# Patient Record
Sex: Female | Born: 1975
Health system: Southern US, Community
[De-identification: ages and names within clinical notes are randomized; demographics above are authoritative.]

## PROBLEM LIST (undated history)

## (undated) DIAGNOSIS — Z62819 Personal history of unspecified abuse in childhood: Secondary | ICD-10-CM

## (undated) DIAGNOSIS — T7840XA Allergy, unspecified, initial encounter: Secondary | ICD-10-CM

## (undated) DIAGNOSIS — K759 Inflammatory liver disease, unspecified: Secondary | ICD-10-CM

## (undated) DIAGNOSIS — R109 Unspecified abdominal pain: Secondary | ICD-10-CM

## (undated) DIAGNOSIS — B977 Papillomavirus as the cause of diseases classified elsewhere: Secondary | ICD-10-CM

## (undated) DIAGNOSIS — K429 Umbilical hernia without obstruction or gangrene: Secondary | ICD-10-CM

## (undated) DIAGNOSIS — G4700 Insomnia, unspecified: Secondary | ICD-10-CM

## (undated) DIAGNOSIS — G8929 Other chronic pain: Secondary | ICD-10-CM

## (undated) DIAGNOSIS — R87612 Low grade squamous intraepithelial lesion on cytologic smear of cervix (LGSIL): Secondary | ICD-10-CM

## (undated) DIAGNOSIS — K219 Gastro-esophageal reflux disease without esophagitis: Secondary | ICD-10-CM

## (undated) DIAGNOSIS — F32A Depression, unspecified: Secondary | ICD-10-CM

## (undated) DIAGNOSIS — E785 Hyperlipidemia, unspecified: Secondary | ICD-10-CM

## (undated) DIAGNOSIS — F419 Anxiety disorder, unspecified: Secondary | ICD-10-CM

## (undated) DIAGNOSIS — E559 Vitamin D deficiency, unspecified: Secondary | ICD-10-CM

## (undated) DIAGNOSIS — F329 Major depressive disorder, single episode, unspecified: Secondary | ICD-10-CM

## (undated) DIAGNOSIS — J45909 Unspecified asthma, uncomplicated: Secondary | ICD-10-CM

## (undated) DIAGNOSIS — D126 Benign neoplasm of colon, unspecified: Secondary | ICD-10-CM

## (undated) DIAGNOSIS — O009 Unspecified ectopic pregnancy without intrauterine pregnancy: Secondary | ICD-10-CM

## (undated) DIAGNOSIS — K2970 Gastritis, unspecified, without bleeding: Secondary | ICD-10-CM

## (undated) DIAGNOSIS — D219 Benign neoplasm of connective and other soft tissue, unspecified: Secondary | ICD-10-CM

## (undated) HISTORY — DX: Personal history of unspecified abuse in childhood: Z62.819

## (undated) HISTORY — DX: Unspecified abdominal pain: R10.9

## (undated) HISTORY — DX: Other chronic pain: G89.29

## (undated) HISTORY — DX: Unspecified ectopic pregnancy without intrauterine pregnancy: O00.90

## (undated) HISTORY — DX: Papillomavirus as the cause of diseases classified elsewhere: B97.7

## (undated) HISTORY — DX: Benign neoplasm of colon, unspecified: D12.6

## (undated) HISTORY — PX: OTHER SURGICAL HISTORY: SHX169

## (undated) HISTORY — DX: Low grade squamous intraepithelial lesion on cytologic smear of cervix (LGSIL): R87.612

## (undated) HISTORY — DX: Gastro-esophageal reflux disease without esophagitis: K21.9

## (undated) HISTORY — DX: Allergy, unspecified, initial encounter: T78.40XA

## (undated) HISTORY — DX: Hyperlipidemia, unspecified: E78.5

## (undated) HISTORY — DX: Unspecified asthma, uncomplicated: J45.909

## (undated) HISTORY — PX: TONSILLECTOMY: SUR1361

## (undated) HISTORY — DX: Benign neoplasm of connective and other soft tissue, unspecified: D21.9

## (undated) HISTORY — DX: Vitamin D deficiency, unspecified: E55.9

## (undated) HISTORY — DX: Insomnia, unspecified: G47.00

## (undated) HISTORY — DX: Gastritis, unspecified, without bleeding: K29.70

## (undated) HISTORY — DX: Umbilical hernia without obstruction or gangrene: K42.9

---

## 2005-11-20 HISTORY — PX: OTHER SURGICAL HISTORY: SHX169

## 2013-11-03 ENCOUNTER — Encounter: Payer: Self-pay | Admitting: *Deleted

## 2013-11-04 ENCOUNTER — Encounter: Payer: Self-pay | Admitting: Emergency Medicine

## 2013-11-04 ENCOUNTER — Ambulatory Visit (INDEPENDENT_AMBULATORY_CARE_PROVIDER_SITE_OTHER): Payer: BC Managed Care – PPO | Admitting: Emergency Medicine

## 2013-11-04 ENCOUNTER — Encounter (INDEPENDENT_AMBULATORY_CARE_PROVIDER_SITE_OTHER): Payer: Self-pay

## 2013-11-04 VITALS — BP 110/70 | HR 82 | Ht 64.0 in | Wt 155.6 lb

## 2013-11-04 DIAGNOSIS — J45909 Unspecified asthma, uncomplicated: Secondary | ICD-10-CM

## 2013-11-04 NOTE — Patient Instructions (Signed)
Please stop your Flovent  Use your albuterol 2 puffs before exercise or if needed for shortness of breath Follow with Dr Delton Coombes in 12 months or sooner if you have any problems. Call if you need to use your albuterol more frequently

## 2013-11-04 NOTE — Progress Notes (Signed)
Subjective:    Patient ID: Sara Bishop, female    DOB: 17-Jun-1976, 37 y.o.   MRN: [id number]  HPI 37 yo woman, never smoker, hx asthma since childhood and on meds since that time. She has albuterol to use prn.  She has been on scheduled meds before but not currently. She rarely has flares, but was treated in late November > SOB, anxiety, CP, "my throat was closing". Was treated for an AE-asthma in the ED, only slightly better. Received pred taper - she had trouble taking it due to side effects. She has had spirometry that shows moderate AFL. She was started on flovent, took it until about 2 days ago. She uses albuterol rarely, although she did use during the flare at Thanksgiving.                                                        Review of Systems  Constitutional: Negative for fever and unexpected weight change.  HENT: Positive for sore throat. Negative for congestion, dental problem, ear pain, nosebleeds, postnasal drip, rhinorrhea, sinus pressure, sneezing and trouble swallowing.   Eyes: Negative for redness and itching.  Respiratory: Positive for chest tightness and shortness of breath. Negative for cough and wheezing.   Cardiovascular: Negative for palpitations and leg swelling.  Gastrointestinal: Positive for abdominal pain. Negative for nausea and vomiting.  Genitourinary: Negative for dysuria.  Musculoskeletal: Negative for joint swelling.  Skin: Negative for rash.  Neurological: Negative for headaches.  Hematological: Does not bruise/bleed easily.  Psychiatric/Behavioral: Positive for dysphoric mood. The patient is nervous/anxious.    Past Medical History  Diagnosis Date  . Asthma   . Umbilical hernia      Family History  Problem Relation Age of Onset  . Hypertension Father      History   Social History  . Marital Status: Unknown    Spouse Name: N/A    Number of Children: N/A  . Years of Education: N/A   Occupational History  . Not on file.   Social  History Main Topics  . Smoking status: Never Smoker   . Smokeless tobacco: Not on file  . Alcohol Use: Yes     Comment: rare  . Drug Use: No  . Sexual Activity: Not on file   Other Topics Concern  . Not on file   Social History Narrative  . No narrative on file  From   Not on File   No outpatient prescriptions prior to visit.   No facility-administered medications prior to visit.       Objective:   Physical Exam Filed Vitals:   11/04/13 1157  BP: 110/70  Pulse: 82  Height: 5\' 4"  (1.626 m)  Weight: 155 lb 9.6 oz (70.58 kg)  SpO2: 99%   Gen: Pleasant, well-nourished, in no distress,  normal affect  ENT: No lesions,  mouth clear,  oropharynx clear, no postnasal drip  Neck: No JVD, no TMG, no carotid bruits  Lungs: No use of accessory muscles, no dullness to percussion, clear without rales or rhonchi  Cardiovascular: RRR, heart sounds normal, no murmur or gallops, no peripheral edema  Musculoskeletal: No deformities, no cyanosis or clubbing  Neuro: alert, non focal  Skin: Warm, no lesions or rashes     Assessment & Plan:  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 Intermittent sx.  - d/c flovent - albuterol prn - will ramp up therapy if sx increase - rov annually

## 2013-11-04 NOTE — Assessment & Plan Note (Signed)
Asthma confirmed on spiro > moderate AFL. Intermittent sx.  - d/c flovent - albuterol prn - will ramp up therapy if sx increase - rov annually

## 2013-11-20 HISTORY — PX: HERNIA REPAIR: SHX51

## 2013-11-20 HISTORY — PX: BREAST ENHANCEMENT SURGERY: SHX7

## 2013-11-20 HISTORY — PX: BREAST SURGERY: SHX581

## 2013-11-25 ENCOUNTER — Other Ambulatory Visit: Payer: Self-pay | Admitting: Family Medicine

## 2013-11-25 DIAGNOSIS — N946 Dysmenorrhea, unspecified: Secondary | ICD-10-CM

## 2013-12-03 ENCOUNTER — Ambulatory Visit
Admission: RE | Admit: 2013-12-03 | Discharge: 2013-12-03 | Disposition: A | Discharge status: Home / Self Care | Payer: BC Managed Care – PPO | Source: Ambulatory Visit | Attending: Family Medicine | Admitting: Family Medicine

## 2013-12-03 DIAGNOSIS — N946 Dysmenorrhea, unspecified: Secondary | ICD-10-CM

## 2013-12-03 IMAGING — US US PELVIS COMPLETE
1 series · 13 of 25 positions shown · non-contrast
Comparison: None

CLINICAL DATA: Dysmenorrhea.  Menorrhagia.  LMP [DATE].

EXAM:
TRANSABDOMINAL AND TRANSVAGINAL ULTRASOUND OF PELVIS
TECHNIQUE: Both transabdominal and transvaginal ultrasound examinations of the
pelvis were performed. Transabdominal technique was performed for
global imaging of the pelvis including uterus, ovaries, adnexal
regions, and pelvic cul-de-sac. It was necessary to proceed with
endovaginal exam following the transabdominal exam to visualize the
endometrium and ovaries.

[Series 1: us pelvis complete · 0.32mm/px · 13 of 70 slices shown]
[im 1/70]
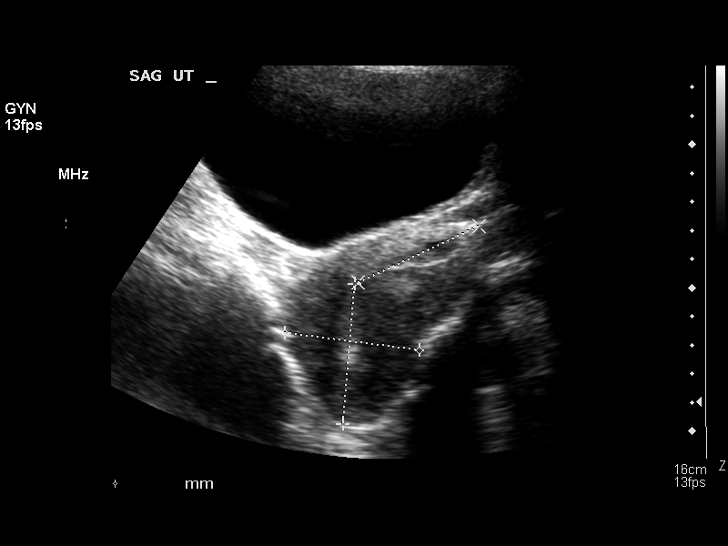
[im 6/70]
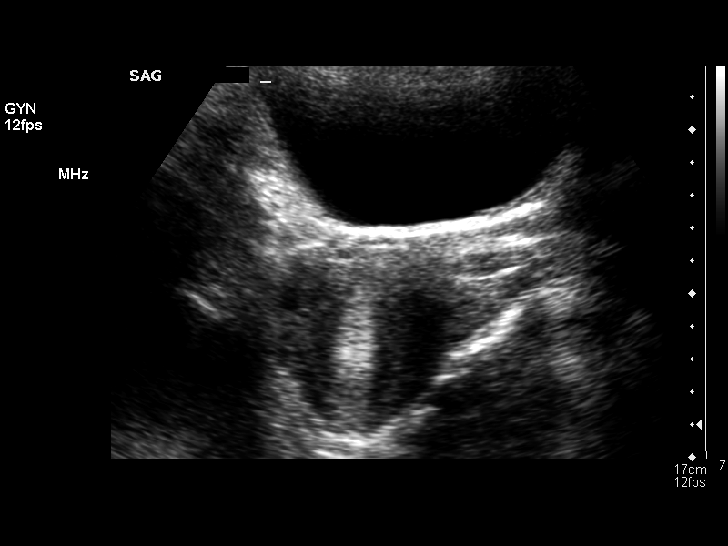
[im 12/70]
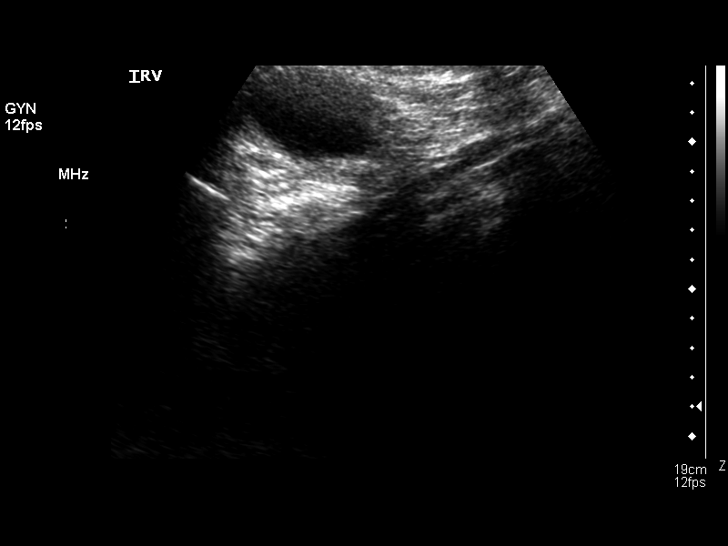
[im 18/70]
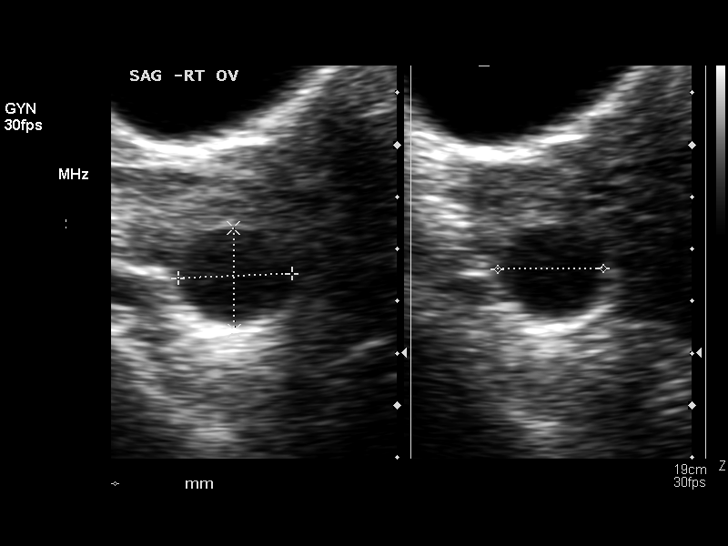
[im 24/70]
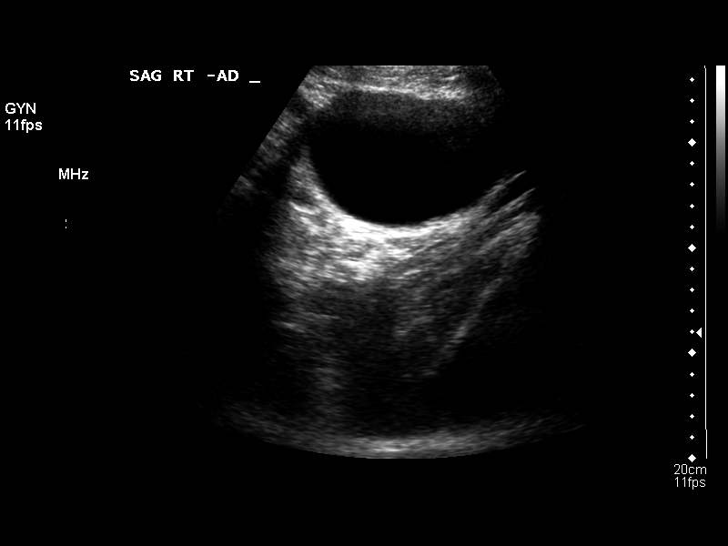
[im 29/70]
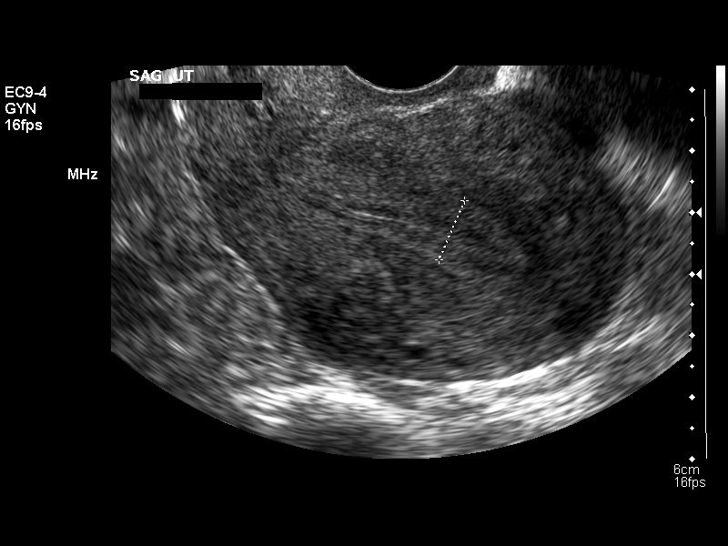
[im 35/70]
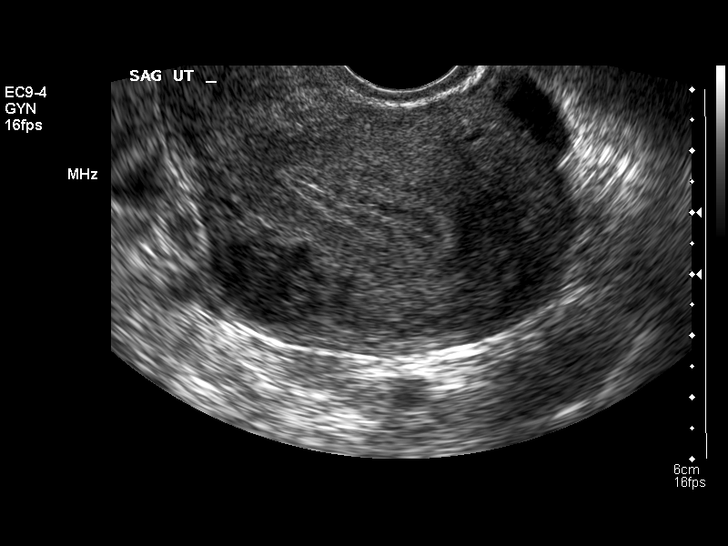
[im 41/70]
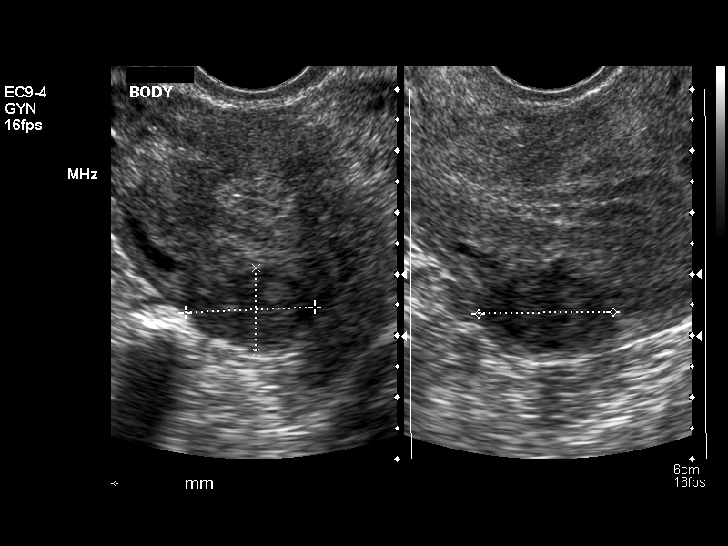
[im 47/70]
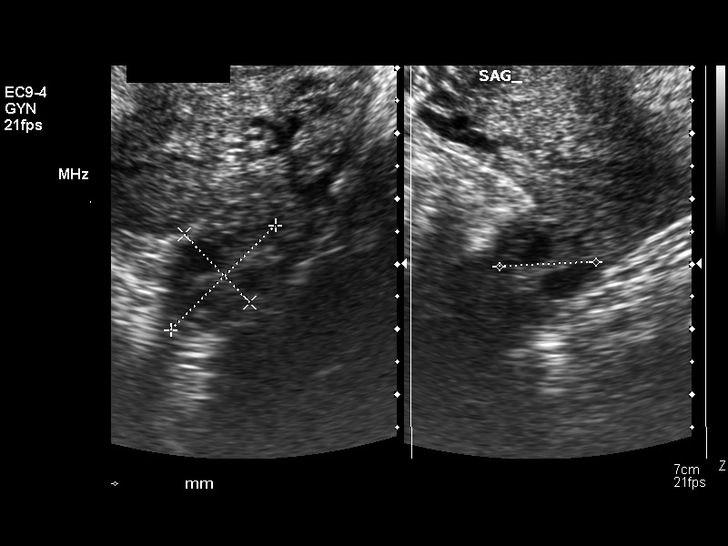
[im 52/70]
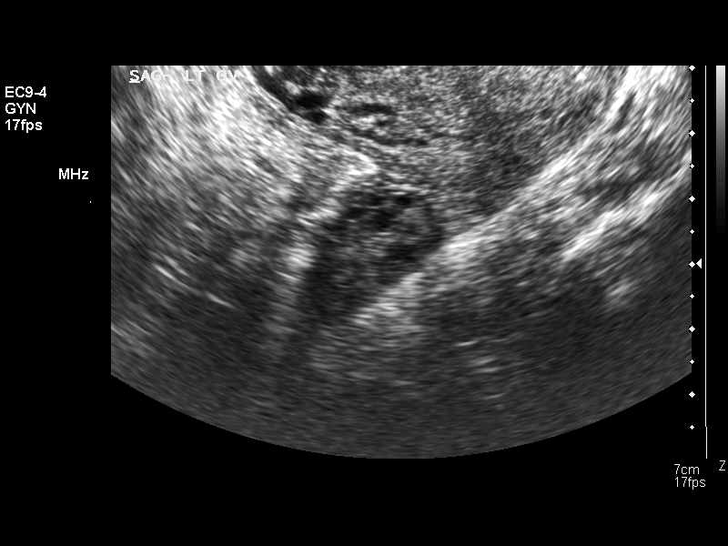
[im 58/70]
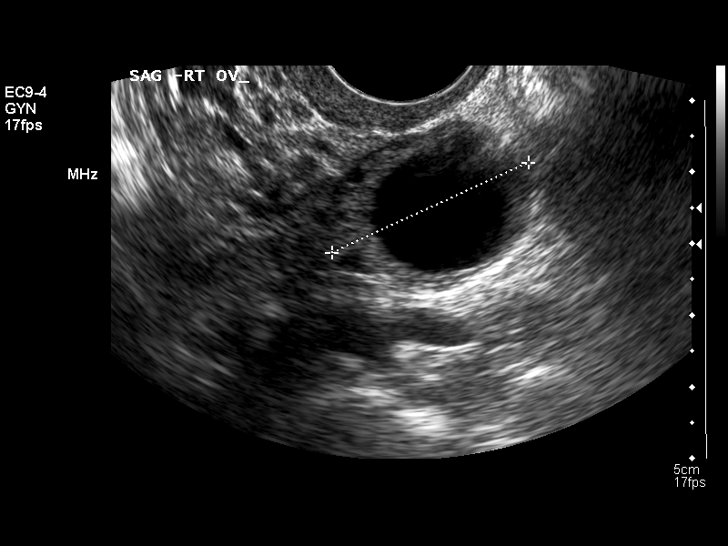
[im 64/70]
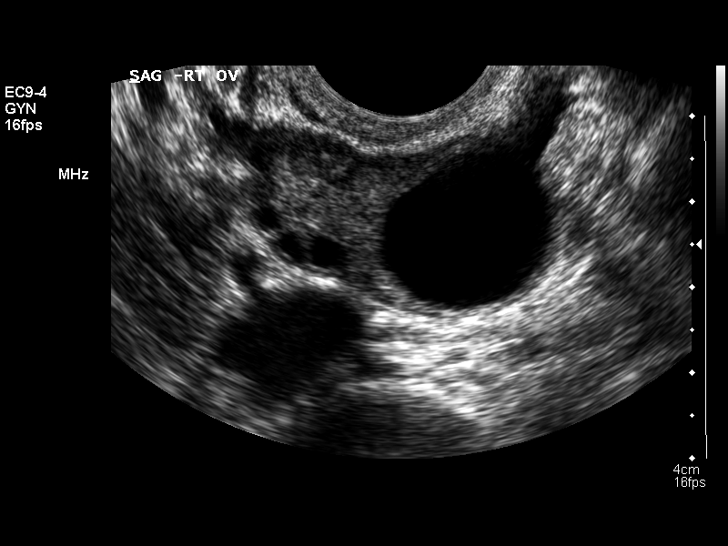
[im 70/70]
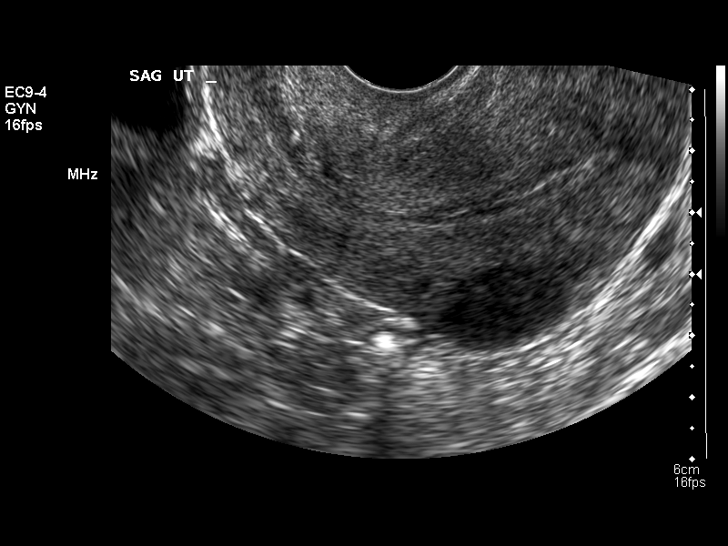

[13 of 25 positions shown; findings below may reference images not displayed]

FINDINGS: Uterus

Measurements: 9.1 x 4.9 x 5.8 cm. Retroverted. A small intramural
fibroid is seen in the anterior fundus measuring 1.2 cm in maximum
diameter. A second intramural fibroid is seen in the anterior corpus
measuring 2.2 cm in maximum diameter.

Endometrium

Thickness: 11 mm transvaginally.  No focal abnormality visualized.

Right ovary

Measurements: 4.0 x 2.2 x 3.0 cm. 2 cm follicle noted. Normal
appearance/no adnexal mass.

Left ovary

Measurements: 2.3 x 1.5 x 1.5 cm. Normal appearance/no adnexal mass.

Other findings

Trace amount of free fluid noted in pelvic cul-de-sac.
IMPRESSION: Two small uterine fibroids, largest measuring 2.2 cm.

Normal appearance of both ovaries.  No adnexal mass identified.

## 2013-12-25 ENCOUNTER — Other Ambulatory Visit: Payer: Self-pay | Admitting: Family Medicine

## 2013-12-25 ENCOUNTER — Other Ambulatory Visit (HOSPITAL_COMMUNITY)
Admission: RE | Admit: 2013-12-25 | Discharge: 2013-12-25 | Disposition: A | Discharge status: Home / Self Care | Payer: BC Managed Care – PPO | Source: Ambulatory Visit | Attending: Family Medicine | Admitting: Family Medicine

## 2013-12-25 DIAGNOSIS — Z113 Encounter for screening for infections with a predominantly sexual mode of transmission: Secondary | ICD-10-CM | POA: Insufficient documentation

## 2013-12-25 DIAGNOSIS — Z1151 Encounter for screening for human papillomavirus (HPV): Secondary | ICD-10-CM | POA: Insufficient documentation

## 2013-12-25 DIAGNOSIS — Z124 Encounter for screening for malignant neoplasm of cervix: Secondary | ICD-10-CM | POA: Insufficient documentation

## 2014-01-05 ENCOUNTER — Ambulatory Visit (INDEPENDENT_AMBULATORY_CARE_PROVIDER_SITE_OTHER): Payer: BC Managed Care – PPO | Admitting: General Surgery

## 2014-01-12 ENCOUNTER — Ambulatory Visit (INDEPENDENT_AMBULATORY_CARE_PROVIDER_SITE_OTHER): Payer: BC Managed Care – PPO | Admitting: General Surgery

## 2014-01-12 ENCOUNTER — Encounter (INDEPENDENT_AMBULATORY_CARE_PROVIDER_SITE_OTHER): Payer: Self-pay | Admitting: General Surgery

## 2014-01-12 VITALS — BP 107/70 | HR 68 | Temp 99.2°F | Resp 14 | Ht 64.0 in | Wt 154.6 lb

## 2014-01-12 DIAGNOSIS — R1033 Periumbilical pain: Secondary | ICD-10-CM

## 2014-01-12 NOTE — Progress Notes (Signed)
Patient ID: Sara Bishop, female   DOB: 25-Jun-1976, 38 y.o.   MRN: [id number]  Chief Complaint  Patient presents with  . Umbilical Hernia    new pt    HPI Sara Bishop is a 38 y.o. female.  She is referred by Dr. [name] for evaluation of possible umbilical hernia and umbilical pain.  The patient gives a two-year history of umbilical pain. She states she feels a bulge occasionally, usually during strenuous exercise or after a big meal. She's never had any hernia surgery.  She says she's been evaluated in [place], [place]. She says that she had an ultrasound and is actually planning to have umbilical hernia repair there at some point in the future. A further questions she said she just wanted another opinion.  Comorbidities included asthma and small uterine fibroids. Otherwise healthy HPI  Past Medical History  Diagnosis Date  . Asthma   . Umbilical hernia     Past Surgical History  Procedure Laterality Date  . Tonsillectomy    . Broken ankle repair      Family History  Problem Relation Age of Onset  . Hypertension Father   . Cancer Maternal Aunt     breast    Social History History  Substance Use Topics  . Smoking status: Never Smoker   . Smokeless tobacco: Not on file  . Alcohol Use: Yes     Comment: rare    No Known Allergies  Current Outpatient Prescriptions  Medication Sig Dispense Refill  . albuterol (PROVENTIL HFA;VENTOLIN HFA) 108 (90 BASE) MCG/ACT inhaler Inhale 2 puffs into the lungs every 6 (six) hours as needed for wheezing or shortness of breath.      . vitamin B-12 (CYANOCOBALAMIN) 1000 MCG tablet Take 1,000 mcg by mouth daily.       No current facility-administered medications for this visit.    Review of Systems Review of Systems  Constitutional: Negative for fever, chills and unexpected weight change.  HENT: Negative for congestion, hearing loss, sore throat, trouble swallowing and voice change.    Eyes: Negative for visual disturbance.  Respiratory: Negative for cough and wheezing.   Cardiovascular: Negative for chest pain, palpitations and leg swelling.  Gastrointestinal: Negative for nausea, vomiting, abdominal pain, diarrhea, constipation, blood in stool, abdominal distention and anal bleeding.  Genitourinary: Negative for hematuria, vaginal bleeding and difficulty urinating.  Musculoskeletal: Negative for arthralgias.  Skin: Negative for rash and wound.  Neurological: Negative for seizures, syncope and headaches.  Hematological: Negative for adenopathy. Does not bruise/bleed easily.  Psychiatric/Behavioral: Negative for confusion.    Blood pressure 107/70, pulse 68, temperature 99.2 F (37.3 C), temperature source Oral, resp. rate 14, height 5\' 4"  (1.626 m), weight 154 lb 9.6 oz (70.126 kg).  Physical Exam Physical Exam  Data Reviewed Dr. Irma Newness office note  Assessment    Umbilical pain. Hernia could not be demonstrated on exam today. There is no indication for surgical intervention at this point in time.  Asthma  Uterine fibroids     Plan    I had a long talk with the patient about my physical findings and the fact that I could not demonstrate a hernia. I gave her the option of doing nothing or scheduling a CT scan of the abdomen and pelvis to make sure were not missing something. It was her desire to go as far as we could be sure were not missing something. This potentially could be a low yield exam.  She'll be scheduled for CT scan of the abdomen and pelvis with contrast.  She will call me after the CT scan is done to discuss the findings and if there is any further steps to take.        Edsel Petrin. Dalbert Batman, M.D., [facility] General and Minimally invasive Surgery Breast and Colorectal Surgery Office:   [phone] Pager:   [phone]  01/12/2014, 5:20 PM

## 2014-01-12 NOTE — Patient Instructions (Signed)
You report a two-year history of intermittent pain at your umbilicus, and you reported that you occasionally feel a bulge either after eating a large meal or during exercise.  On examination today we cannot demonstrate a bulge or a hernia or any other disease process of the umbilicus. There is therefore no immediate indication for surgery.  We talked about different strategies for managing your pain. It was your preference to proceed with imaging to make sure that there is not something else going on behind the umbilicus.  Please call Dr. Darrel Hoover office a couple of days after the CT scan is done and we will discuss this with you.  Dr. Dalbert Batman will be happy to see you again in the future if necessary.

## 2014-01-20 ENCOUNTER — Other Ambulatory Visit: Payer: BC Managed Care – PPO

## 2014-01-20 ENCOUNTER — Ambulatory Visit
Admission: RE | Admit: 2014-01-20 | Discharge: 2014-01-20 | Disposition: A | Discharge status: Home / Self Care | Payer: BC Managed Care – PPO | Source: Ambulatory Visit | Attending: General Surgery | Admitting: General Surgery

## 2014-01-20 IMAGING — CT CT ABD-PELV W/ CM
2 of 4 series · 10 of 36 positions shown, 17 images · IV contrast (omnipaque)
Comparison: US TRANSVAGINAL NON-OB dated [DATE]

CLINICAL DATA: Umbilical hernia

EXAM:
CT ABDOMEN AND PELVIS WITH CONTRAST
TECHNIQUE: Multidetector CT imaging of the abdomen and pelvis was performed
using the standard protocol following bolus administration of
intravenous contrast.
CONTRAST:  100 cc Omnipaque 300

[Series 3: abd/pelvis with · axial · 0.70mm/px · z∈[-352,-17]mm · 9 of 85 slices shown, 15 images]
[im 9/85  soft-tissue]
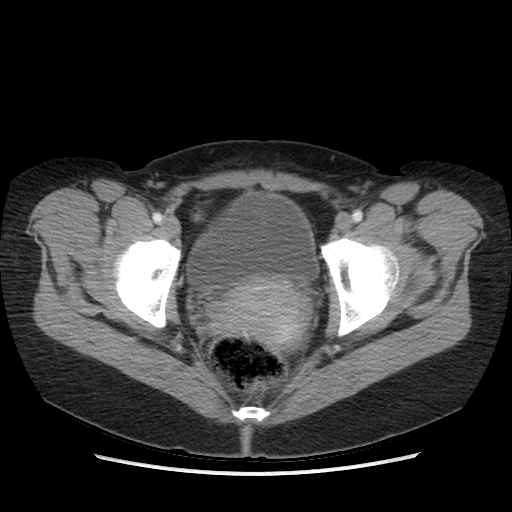
[im 9/85  bone]
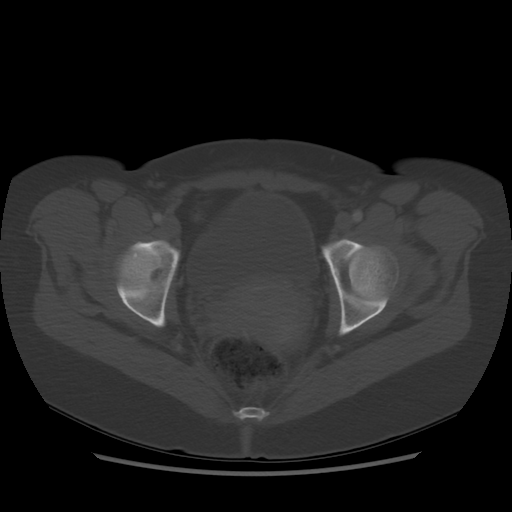
[im 17/85  soft-tissue]
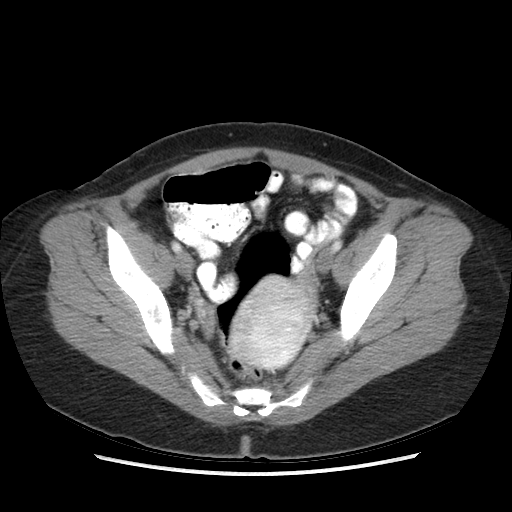
[im 26/85  soft-tissue]
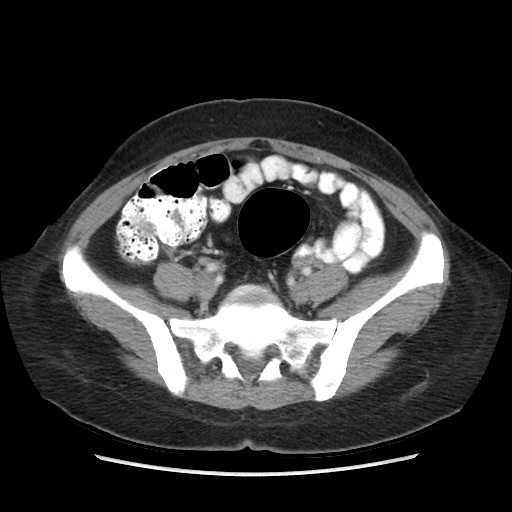
[im 34/85  soft-tissue]
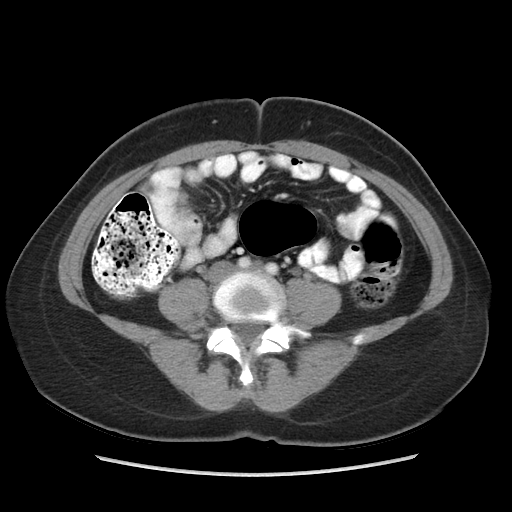
[im 43/85  soft-tissue]
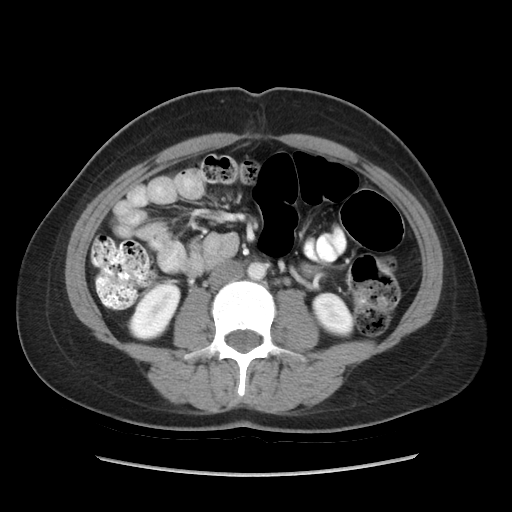
[im 51/85  soft-tissue]
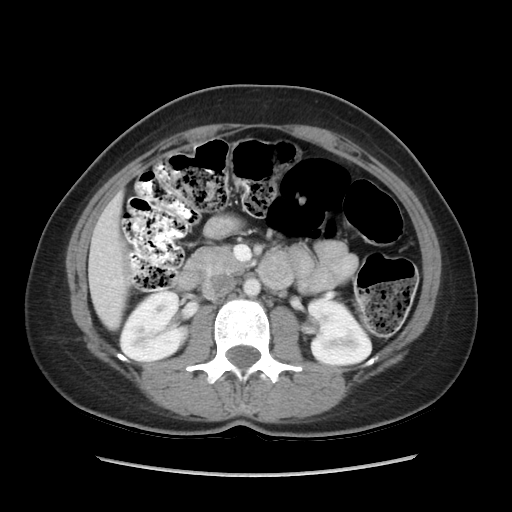
[im 51/85  lung]
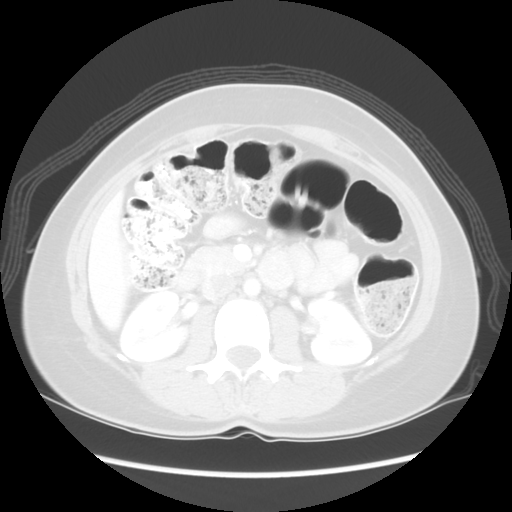
[im 59/85  soft-tissue]
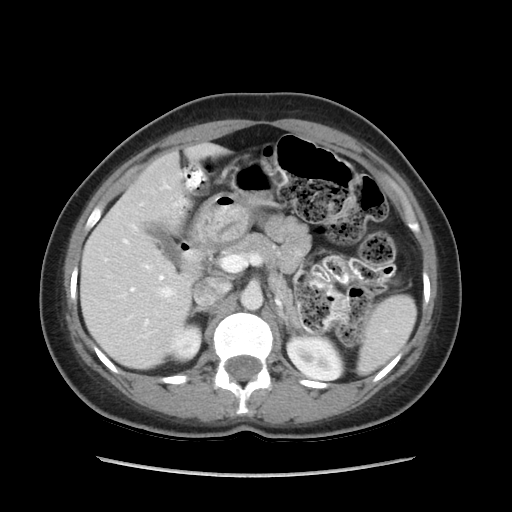
[im 59/85  lung]
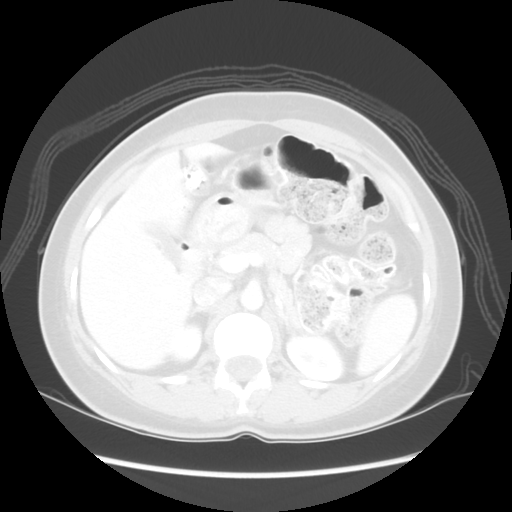
[im 68/85  soft-tissue]
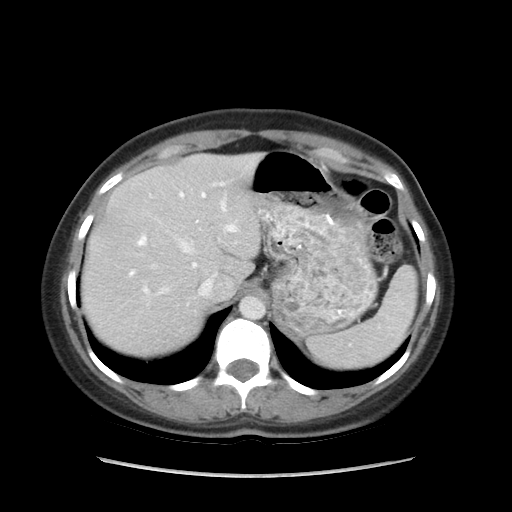
[im 68/85  lung]
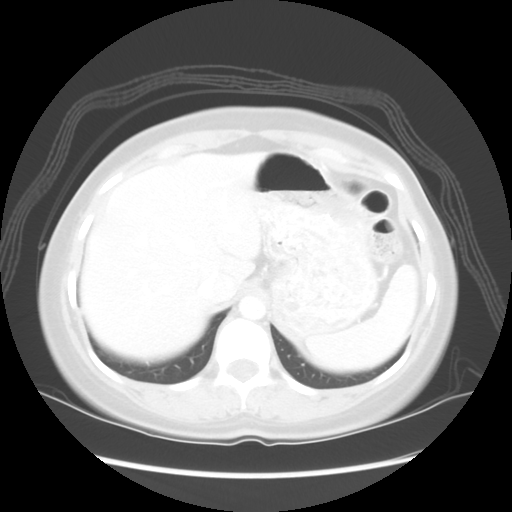
[im 76/85  soft-tissue]
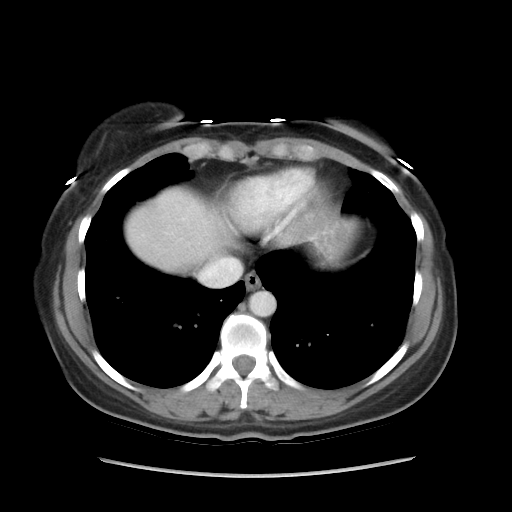
[im 76/85  lung]
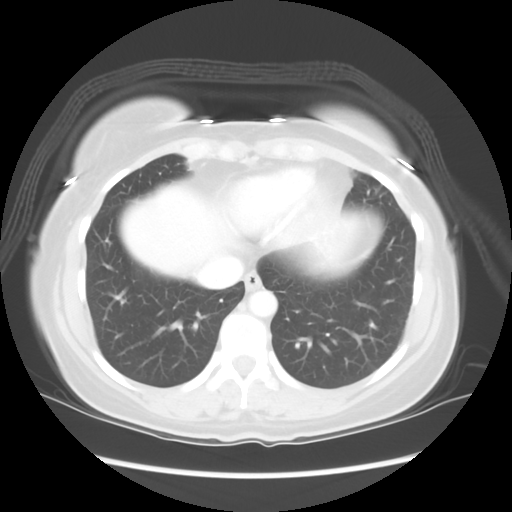
[im 76/85  bone]
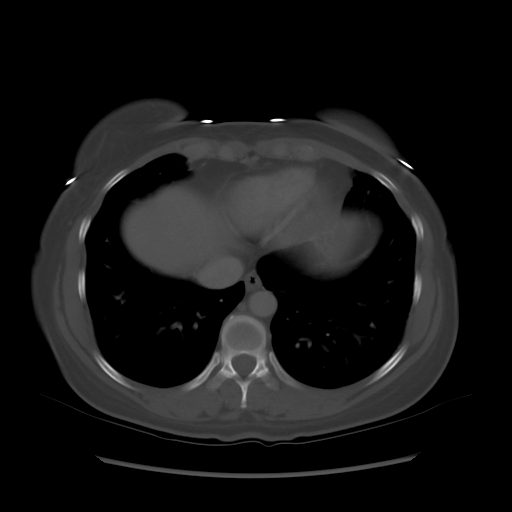

[Series 601: coronal body · coronal · 0.89mm/px · 1 of 111 slices shown, 2 images]
[im 37/111  soft-tissue]
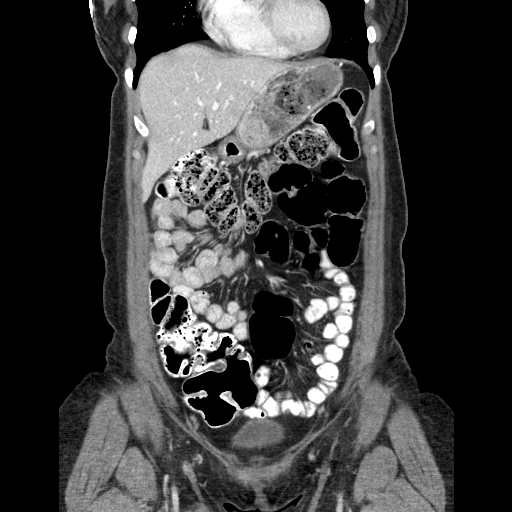
[im 37/111  bone]
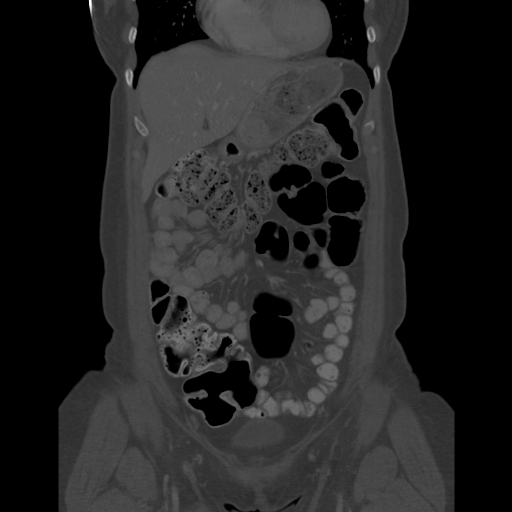

[10 of 36 positions shown; findings below may reference images not displayed]

FINDINGS: The liver, spleen, pancreas, and adrenal glands appear unremarkable.
Contracted gallbladder noted. Kidneys and proximal ureters
unremarkable. No pathologic upper abdominal adenopathy is observed.
Orally administered contrast extends to the descending colon.

No pathologic pelvic adenopathy is observed.

Small umbilical and supraumbilical hernias are immediately adjacent
and difficult to separate. The herniated adipose tissue in this
vicinity measures 2.5 cm craniocaudad by 2.0 cm anterior-posterior
by 1.8 cm transverse. Small hernia neck observed on image 71 of
series 602. No other hernia noted. Uterus retroverted with
indistinctness of previously identified fibroids.

Appendix not well seen.
IMPRESSION: 1. Small supraumbilical and umbilical hernia contains adipose
tissue.

## 2014-01-20 MED ORDER — IOHEXOL 300 MG/ML  SOLN: 100.0000 mL | Freq: Once | INTRAMUSCULAR | Status: AC | PRN

## 2014-01-26 ENCOUNTER — Encounter: Payer: Self-pay | Admitting: Neurology

## 2014-01-28 ENCOUNTER — Telehealth (INDEPENDENT_AMBULATORY_CARE_PROVIDER_SITE_OTHER): Payer: Self-pay | Admitting: General Surgery

## 2014-01-28 ENCOUNTER — Telehealth (INDEPENDENT_AMBULATORY_CARE_PROVIDER_SITE_OTHER): Payer: Self-pay

## 2014-01-28 ENCOUNTER — Ambulatory Visit (INDEPENDENT_AMBULATORY_CARE_PROVIDER_SITE_OTHER): Payer: BC Managed Care – PPO | Admitting: Neurology

## 2014-01-28 ENCOUNTER — Encounter: Payer: Self-pay | Admitting: Neurology

## 2014-01-28 VITALS — BP 103/67 | HR 71 | Ht 64.0 in | Wt 153.0 lb

## 2014-01-28 DIAGNOSIS — G500 Trigeminal neuralgia: Secondary | ICD-10-CM | POA: Insufficient documentation

## 2014-01-28 NOTE — Progress Notes (Signed)
Reason for visit: Neuralgia pain  Sara Bishop is a 38 y.o. female  History of present illness:  Ms. Sara Bishop is a 38 year old right-handed patient with a ten-year history of intermittent sharp jabbing pains in the left mandible. The patient indicates that the pains are very well localized to the mid section of the jaw, and the pain can vary in severity, at times being quite sharp. The pain rarely last more than 4 to 5 seconds at a time. The episodes are daily, but the very severe pain is very occasional. The patient does have other types of headaches in that may occur once or twice a month. The patient has a sensation of weakness and fatigue in the legs. The patient denies problems controlling the bowels or the bladder. The patient denies any balance issues. The patient does not have any particular activating factors for the pain such as chewing, talking, or swallowing. The patient has undergone some dental work but the pain has not improved. The patient comes to this office for further evaluation.  Past Medical History  Diagnosis Date  . Asthma   . Umbilical hernia   . Fibroids     Past Surgical History  Procedure Laterality Date  . Tonsillectomy    . Broken ankle repair      right    Family History  Problem Relation Age of Onset  . Hypertension Father   . Cancer Maternal Aunt     breast    Social history:  reports that she has never smoked. She has never used smokeless tobacco. She reports that she drinks alcohol. She reports that she does not use illicit drugs.  Medications:  Current Outpatient Prescriptions on File Prior to Visit  Medication Sig Dispense Refill  . albuterol (PROVENTIL HFA;VENTOLIN HFA) 108 (90 BASE) MCG/ACT inhaler Inhale 2 puffs into the lungs every 6 (six) hours as needed for wheezing or shortness of breath.      . vitamin B-12 (CYANOCOBALAMIN) 1000 MCG tablet Take 1,000 mcg by mouth daily.       No current facility-administered medications on  file prior to visit.      Allergies  Allergen Reactions  . Iohexol Cough    Mild reaction, patient refused meds ( benadryl , patient drank plenty of fluids, we observed  her for 30 minutes or until stable    ROS:  Out of a complete 14 system review of symptoms, the patient complains only of the following symptoms, and all other reviewed systems are negative.  Ringing in the ears Shortness of breath Allergies, runny nose Depression, anxiety, decreased energy, disinterest in activities, suicidal thoughts Restless legs  Blood pressure 103/67, pulse 71, height 5\' 4"  (1.626 m), weight 153 lb (69.4 kg).  Physical Exam  General: The patient is alert and cooperative at the time of the examination.  Eyes: Pupils are equal, round, and reactive to light. Discs are flat bilaterally.  Neck: The neck is supple, no carotid bruits are noted.  Respiratory: The respiratory examination is clear.  Cardiovascular: The cardiovascular examination reveals a regular rate and rhythm, no obvious murmurs or rubs are noted.  Neuromuscular: No crepitus is noted within the temporomandibular joints on either side. Range of movement of the cervical spine is full and normal.  Skin: Extremities are without significant edema.  Neurologic Exam  Mental status: The patient is alert and oriented x 3 at the time of the examination. The patient has apparent normal recent and remote memory, with an apparently  normal attention span and concentration ability.  Cranial nerves: Facial symmetry is present. There is good sensation of the face to pinprick and soft touch bilaterally. The strength of the facial muscles and the muscles to head turning and shoulder shrug are normal bilaterally. Speech is well enunciated, no aphasia or dysarthria is noted. Extraocular movements are full. Visual fields are full. The tongue is midline, and the patient has symmetric elevation of the soft palate. No obvious hearing deficits are  noted.  Motor: The motor testing reveals 5 over 5 strength of all 4 extremities. Good symmetric motor tone is noted throughout.  Sensory: Sensory testing is intact to pinprick, soft touch, vibration sensation, and position sense on all 4 extremities. No evidence of extinction is noted.  Coordination: Cerebellar testing reveals good finger-nose-finger and heel-to-shin bilaterally.  Gait and station: Gait is normal. Tandem gait is normal. Romberg is negative. No drift is seen.  Reflexes: Deep tendon reflexes are symmetric and normal bilaterally. Toes are downgoing bilaterally.   Assessment/Plan:  1. 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 This is somewhat atypical for true trigeminal neuralgia, but if no other dental etiology has been determined, this pain could be related to a trigeminal neuralgia. The patient does not wish to go on daily medications for the pain. I would recommend MRI evaluation of the brain to exclude demyelinating disease, but the patient wants to hold off at this time. The patient will contact our office if needed.  Sara Alexanders MD 01/28/2014 7:13 PM  [facility] [address] [place], [place] [address]  Phone [phone] Fax [phone]

## 2014-01-28 NOTE — Telephone Encounter (Signed)
CT scan of the abdomen and pelvis shows a very tiny hernia at the umbilicus and slightly above. This contains a small amount of fat but there are no other abnormalities. I called the patient and discussed this with her. She states that she is planning to return to her country, [place], and she has a Psychologist, sport and exercise there the plan is to repair the hernia.   I asked if she would like a copy of the CT scan report and she said yes. She is going to call the office tomorrow and one of our clinic staff can arrange to either mail  or fax it to her.   She will return to see me on an as-needed basis.  Sara Bishop M 4:44 PM

## 2014-01-28 NOTE — Telephone Encounter (Signed)
Telephone call with patient.  Patient declines coming into the office to review CT results from 01/20/14.  Would like Dr. Dalbert Batman to call her after 3:00pm to discuss.  Staff message sent to Dr. Dalbert Batman.

## 2014-01-28 NOTE — Patient Instructions (Signed)
Trigeminal Neuralgia Trigeminal neuralgia is a nerve disorder that causes sudden attacks of severe facial pain. It is caused by damage to the trigeminal nerve, a major nerve in the face. It is more common in women and in the elderly, although it can also happen in younger patients. Attacks last from a few seconds to several minutes and can occur from a couple of times per year to several times per day. Trigeminal neuralgia can be a very distressing and disabling condition. Surgery may be needed in very severe cases if medical treatment does not give relief. HOME CARE INSTRUCTIONS   If your caregiver prescribed medication to help prevent attacks, take as directed.  To help prevent attacks:  Chew on the unaffected side of the mouth.  Avoid touching your face.  Avoid blasts of hot or cold air.  Men may wish to grow a beard to avoid having to shave. SEEK IMMEDIATE MEDICAL CARE IF:  Pain is unbearable and your medicine does not help.  You develop new, unexplained symptoms (problems).  You have problems that may be related to a medication you are taking. Document Released: 11/03/2000 Document Revised: 01/29/2012 Document Reviewed: 09/03/2009 [employer] Patient Information 2014 [employer].  

## 2015-11-21 HISTORY — PX: ESOPHAGOGASTRODUODENOSCOPY: SHX1529

## 2015-12-06 ENCOUNTER — Other Ambulatory Visit: Payer: Self-pay | Admitting: Gastroenterology

## 2016-09-20 ENCOUNTER — Other Ambulatory Visit: Payer: Self-pay | Admitting: Physician Assistant

## 2016-09-20 ENCOUNTER — Other Ambulatory Visit (HOSPITAL_COMMUNITY): Payer: Self-pay | Admitting: Physician Assistant

## 2016-09-20 DIAGNOSIS — R110 Nausea: Secondary | ICD-10-CM

## 2016-09-20 DIAGNOSIS — R1013 Epigastric pain: Secondary | ICD-10-CM

## 2016-09-20 DIAGNOSIS — R140 Abdominal distension (gaseous): Secondary | ICD-10-CM

## 2016-09-20 DIAGNOSIS — R1032 Left lower quadrant pain: Secondary | ICD-10-CM

## 2016-09-22 ENCOUNTER — Ambulatory Visit
Admission: RE | Admit: 2016-09-22 | Discharge: 2016-09-22 | Disposition: A | Discharge status: Home / Self Care | Payer: BLUE CROSS/BLUE SHIELD | Source: Ambulatory Visit | Attending: Physician Assistant | Admitting: Physician Assistant

## 2016-09-22 DIAGNOSIS — R110 Nausea: Secondary | ICD-10-CM

## 2016-09-22 DIAGNOSIS — R1032 Left lower quadrant pain: Secondary | ICD-10-CM

## 2016-09-22 DIAGNOSIS — R140 Abdominal distension (gaseous): Secondary | ICD-10-CM

## 2016-09-22 DIAGNOSIS — R1013 Epigastric pain: Secondary | ICD-10-CM

## 2016-09-22 IMAGING — CT CT ABD-PELV W/O CM
1 of 2 series · 15 of 32 positions shown, 19 images · non-contrast
Comparison: [DATE]

CLINICAL DATA: Epigastric pain

EXAM:
CT ABDOMEN AND PELVIS WITHOUT CONTRAST
TECHNIQUE: Multidetector CT imaging of the abdomen and pelvis was performed
following the standard protocol without IV contrast.

[Series 2: abd/pelvis w/(date) · axial · 0.67mm/px · z∈[-742,-292]mm · 15 of 100 slices shown, 19 images]
[im 5/100  soft-tissue]
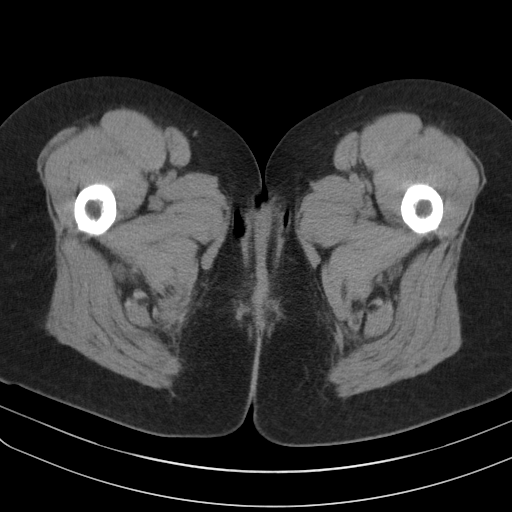
[im 5/100  bone]
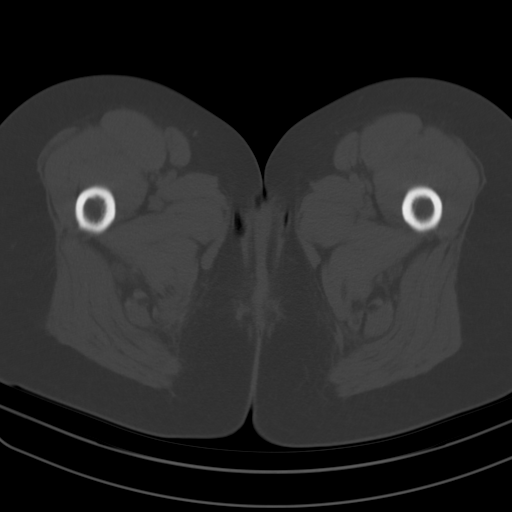
[im 13/100  soft-tissue]
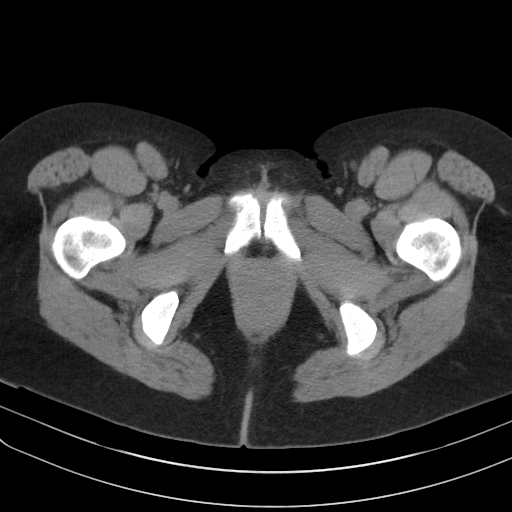
[im 21/100  soft-tissue]
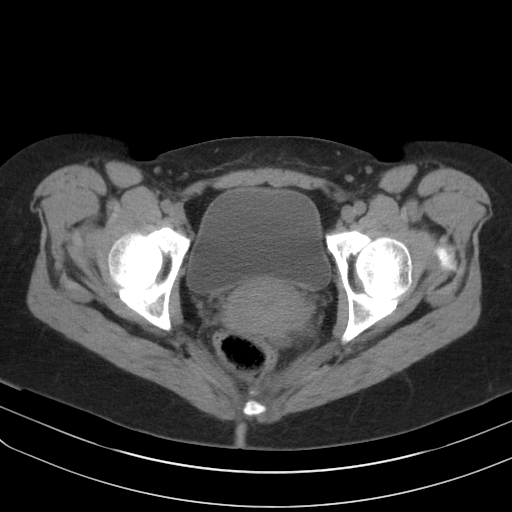
[im 29/100  soft-tissue]
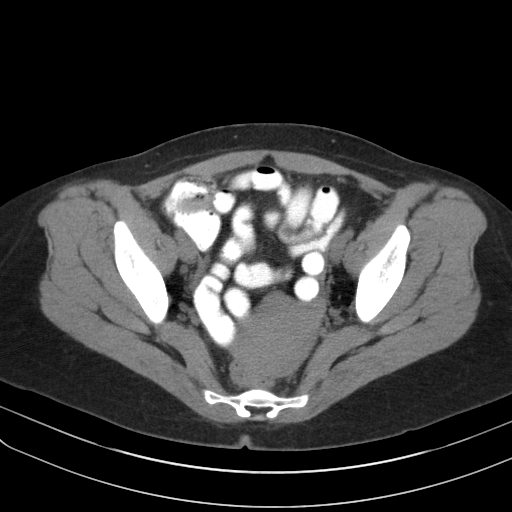
[im 34/100  soft-tissue]
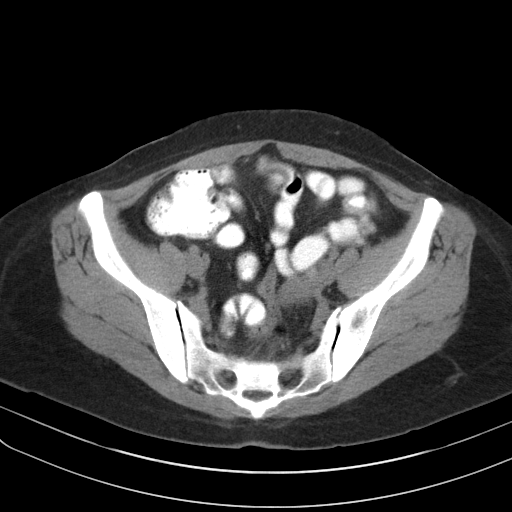
[im 42/100  soft-tissue]
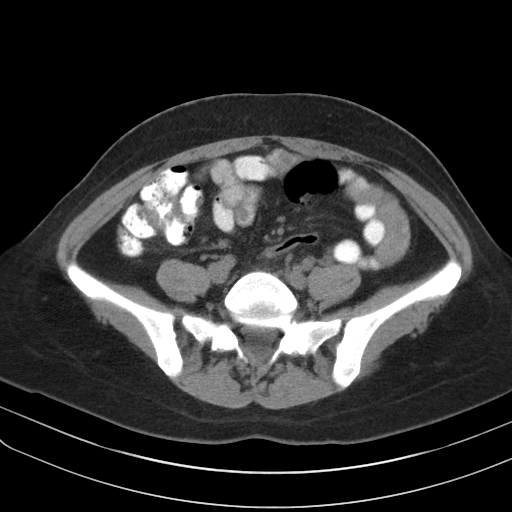
[im 50/100  soft-tissue]
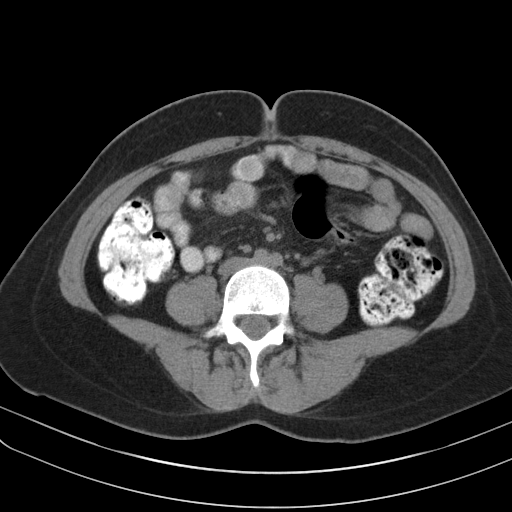
[im 58/100  soft-tissue]
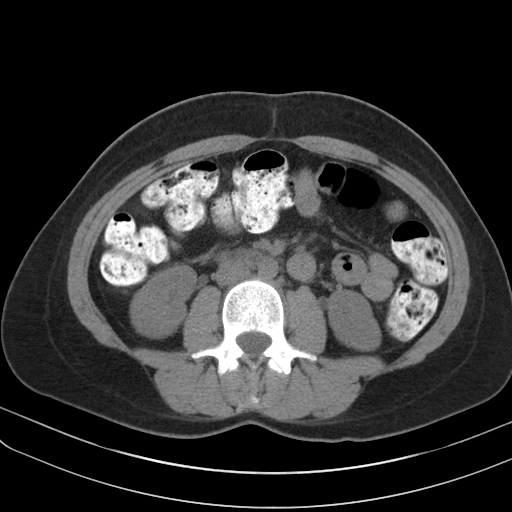
[im 67/100  soft-tissue]
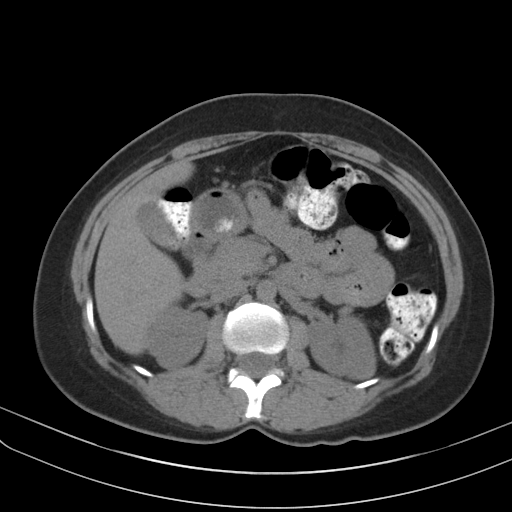
[im 67/100  bone]
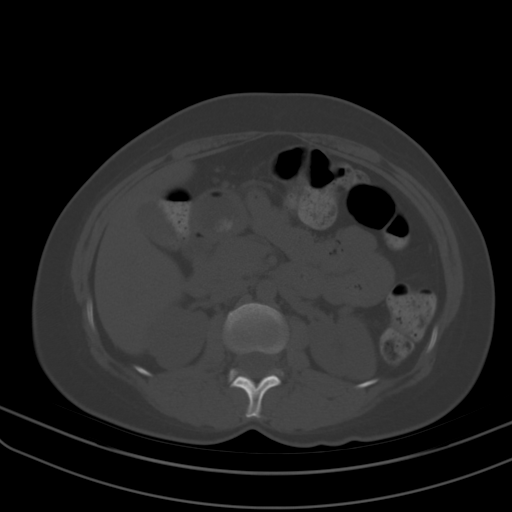
[im 71/100  soft-tissue]
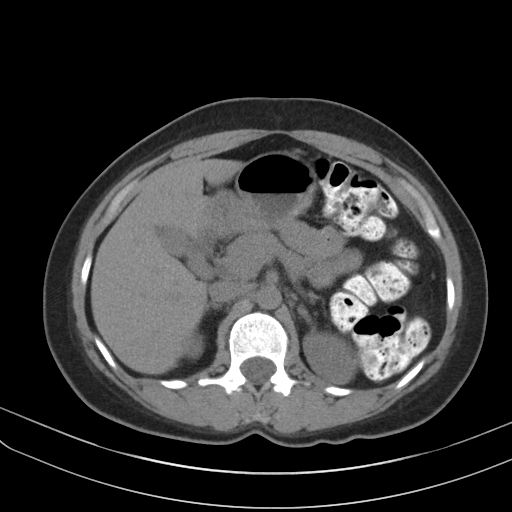
[im 79/100  soft-tissue]
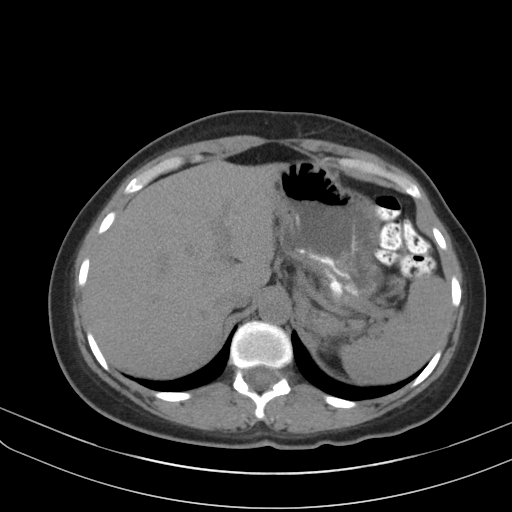
[im 83/100  lung]
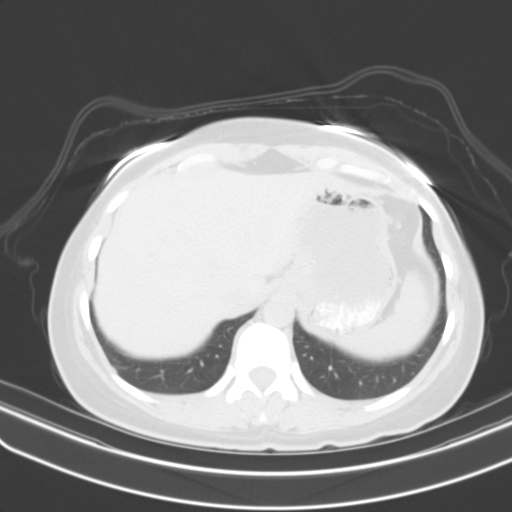
[im 87/100  soft-tissue]
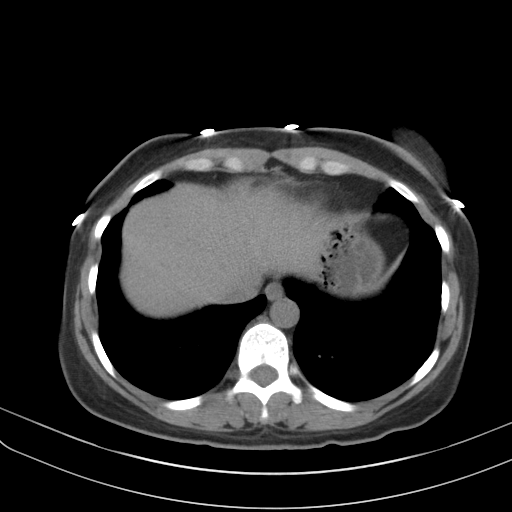
[im 87/100  lung]
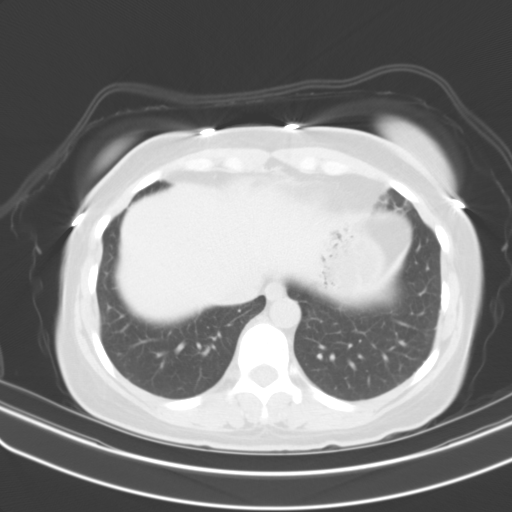
[im 91/100  lung]
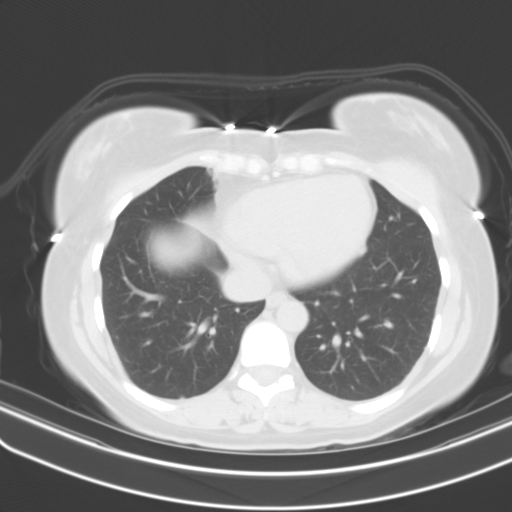
[im 95/100  soft-tissue]
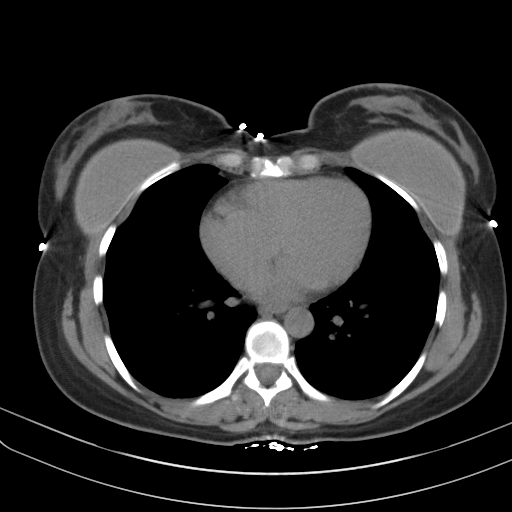
[im 95/100  lung]
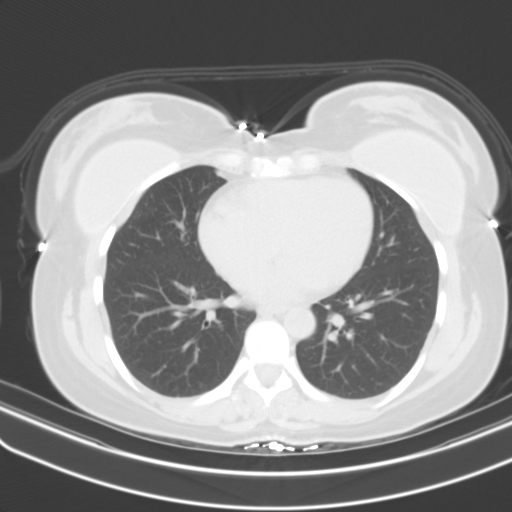

[15 of 32 positions shown; findings below may reference images not displayed]

FINDINGS: Lower chest: Lung bases are free of acute infiltrate or sizable
effusion. Bilateral breast implants are seen.

Hepatobiliary: No focal liver abnormality is seen. No gallstones,
gallbladder wall thickening, or biliary dilatation.

Pancreas: Unremarkable. No pancreatic ductal dilatation or
surrounding inflammatory changes.

Spleen: Normal in size without focal abnormality.

Adrenals/Urinary Tract: Adrenal glands are within normal limits
bilaterally. No renal calculi or obstructive changes are seen. The
bladder is well distended.

Stomach/Bowel: Stomach is within normal limits. Appendix appears
normal. No evidence of bowel wall thickening, distention, or
inflammatory changes.

Vascular/Lymphatic: No significant vascular findings are present. No
enlarged abdominal or pelvic lymph nodes.

Reproductive: Uterus and bilateral adnexa are unremarkable.

Other: No abdominal wall hernia or abnormality. No abdominopelvic
ascites.

Musculoskeletal: No acute or significant osseous findings.
IMPRESSION: No acute abnormality noted.

## 2016-09-28 ENCOUNTER — Ambulatory Visit (HOSPITAL_COMMUNITY)
Admission: RE | Admit: 2016-09-28 | Discharge: 2016-09-28 | Disposition: A | Discharge status: Home / Self Care | Payer: BLUE CROSS/BLUE SHIELD | Source: Ambulatory Visit | Attending: Physician Assistant | Admitting: Physician Assistant

## 2016-09-28 ENCOUNTER — Ambulatory Visit (HOSPITAL_COMMUNITY): Payer: Self-pay

## 2016-09-28 DIAGNOSIS — R932 Abnormal findings on diagnostic imaging of liver and biliary tract: Secondary | ICD-10-CM | POA: Insufficient documentation

## 2016-09-28 DIAGNOSIS — R1013 Epigastric pain: Secondary | ICD-10-CM | POA: Insufficient documentation

## 2016-09-28 DIAGNOSIS — R110 Nausea: Secondary | ICD-10-CM | POA: Diagnosis present

## 2016-09-28 IMAGING — NM NM HEPATO W/GB/PHARM/[PERSON_NAME]
2 series · 12 of 12 positions shown · non-contrast
Comparison: Abdominal and pelvic CT scan [DATE]

CLINICAL DATA: Epigastric pain and nausea for the past 4 months.

EXAM:
NUCLEAR MEDICINE HEPATOBILIARY IMAGING WITH GALLBLADDER EF
TECHNIQUE: Sequential images of the abdomen were obtained [DATE] minutes
following intravenous administration of radiopharmaceutical. After
oral ingestion of Ensure, gallbladder ejection fraction was
determined. At 60 min, normal ejection fraction is greater than 33%.
RADIOPHARMACEUTICALS:  5 millicuries mCi [KO]  Choletec IV

[he hepatobiliary · 3.43mm/px · 6 of 60 frames shown (1 of 2)]
[frame 6/60]
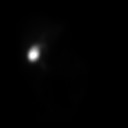
[frame 16/60]
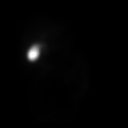
[frame 26/60]
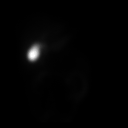
[frame 36/60]
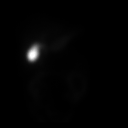
[frame 46/60]
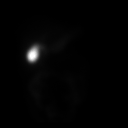
[frame 56/60]
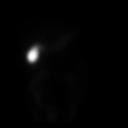

[he hepatobiliary · 3.43mm/px · 6 of 60 frames shown (2 of 2)]
[frame 6/60]
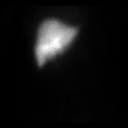
[frame 16/60]
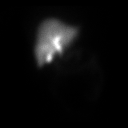
[frame 26/60]
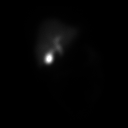
[frame 36/60]
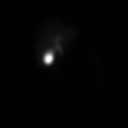
[frame 46/60]
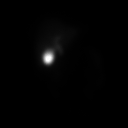
[frame 56/60]
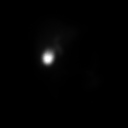

[12 of 12 positions shown; findings below may reference images not displayed]

FINDINGS: Prompt uptake and biliary excretion of activity by the liver is
seen. Gallbladder activity is visualized, consistent with patency of
cystic duct. Biliary activity passes into small bowel, consistent
with patent common bile duct.

Calculated gallbladder ejection fraction is 9% which is abnormally
low. (Normal gallbladder ejection fraction with Ensure is greater
than 33%.)
IMPRESSION: No evidence of cystic duct or common bile duct obstruction.
Abnormally low gallbladder ejection fraction of only 9%. The patient
reported no pain following Ensure ingestion.

## 2016-09-28 MED ORDER — TECHNETIUM TC 99M MEBROFENIN IV KIT
5.0000 | PACK | Freq: Once | INTRAVENOUS | Status: AC | PRN
  Administered 2016-09-28: 5 via INTRAVENOUS

## 2016-10-20 ENCOUNTER — Ambulatory Visit: Payer: Self-pay | Admitting: Surgery

## 2016-10-20 ENCOUNTER — Encounter: Payer: Self-pay | Admitting: Surgery

## 2016-10-20 NOTE — H&P (Signed)
  Sara Bishop Patient #: [id number] DOB: 03/10/1976 Single / Language: English Female  History of Present Illness  Patient words: This is a very pleasant and otherwise healthy woman who has had issues with abdominal pain since December of last year. She was initially seen by gastroenterologist and underwent upper endoscopy which revealed gastritis. She was put on a proton pump inhibitor and Carafate which did improve her symptoms. However they returned shortly, even despite an increase in her dosage of Prilosec. Her pain is in the epigastrium and subxiphoid region and radiates straight through to her back. It is sharp in quality. This is exacerbated by fatty foods and large meals. She has recently been on a diet of soup and crackers and tea, which has afforded her some relief. This is associated with nausea and rare emesis. No fevers but she does endorse chills. She has had issues with diarrhea in the past but currently reports constipation. Her only prior abdominal surgical history is a supraumbilical hernia repair. She underwent a CT scan of the abdomen and pelvis which was negative for any abnormality. She also had a HIDA scan that did reveal a gallbladder ejection fraction of 9% consistent with biliary dyskinesia.  She is otherwise healthy. She works as an Higher education careers adviser.  Allergies  No Known Drug Allergies   Medication History  Levocetirizine Dihydrochloride (5MG  Tablet, Oral daily) Active. Escitalopram Oxalate (10MG  Tablet, Oral daily) Active. Dicyclomine HCl (10MG  Capsule, Oral daily) Active. Omeprazole (40MG  Capsule DR, Oral daily) Active.  Review of Systems All other systems negative   There were no vitals filed for this visit.   Physical Exam  The physical exam findings are as follows: Note:She is alert and oriented in no distress Unlabored respirations, clear bilaterally extraocular motion intact, anicteric moist mucus membranes, good dentition Neck without  mass or thyromegaly Regular rate and rhythm with palpable distal pulses, no peripheral edema Abdomen is soft nontender nondistended. There is a vertical supraumbilical umbilical scar about 2 cm in length. No hernia or mass, no organomegaly Extremities are warm without deformity Neuro grossly intact, normal gait Psychiatric normal mood and affect    Assessment & Plan   BILIARY DYSKINESIA (Principal Diagnosis) (K82.8) Story: Gallbladder EF 9% on recent HIDA. CT negative for abnormality. will plan for lap chole. discussed nature of surgery and risks of bleeding, infection, pain, scarring, injury to intraabdominal structures particularly common bile duct and sequelae.

## 2016-11-17 NOTE — Patient Instructions (Addendum)
Lateisha Deitz  11/17/2016   Your procedure is scheduled on: Wednesday 11/29/2016  Report to [facility] Main  Entrance take [facility]  elevators to 3rd floor to  [place] [facility] [place].  Call this number if you have problems the morning of surgery [phone]   Remember: ONLY 1 PERSON MAY GO WITH YOU TO SHORT STAY TO GET  READY MORNING OF [place].  Do not eat food After Midnight. May have clear liquids until 9am DAY OF SURGERY. Nothing by mouth after 9am day of surgery.     Take these medicines the morning of surgery with A SIP OF WATER: Tylenol as needed, Albuterol inhaler if needed and bring  inhaler with you, Omeprazole if needed, Tramadol if needed.                                You may not have any metal on your body including hair pins and              piercings  Do not wear jewelry, make-up, lotions, powders or perfumes, deodorant             Do not wear nail polish.  Do not shave  48 hours prior to surgery.               Do not bring valuables to the hospital. [place].  Contacts, dentures or bridgework may not be worn into surgery.      Patients discharged the day of surgery will not be allowed to drive home.  Name and phone number of your driver:  Special Instructions: N/A              Please read over the following fact sheets you were given: _____________________________________________________________________                CLEAR LIQUID DIET   Foods Allowed                                                                     Foods Excluded  Coffee and tea, regular and decaf                             liquids that you cannot  Plain Jell-O in any flavor                                             see through such as: Fruit ices (not with fruit pulp)                                     milk, soups, orange juice  Iced Popsicles  All solid  food Carbonated beverages, regular and diet                                    Cranberry, grape and apple juices Sports drinks like Gatorade Lightly seasoned clear broth or consume(fat free) Sugar, honey syrup  Sample Menu Breakfast                                Lunch                                     Supper Cranberry juice                    Beef broth                            Chicken broth Jell-O                                     Grape juice                           Apple juice Coffee or tea                        Jell-O                                      Popsicle                                                Coffee or tea                        Coffee or tea  _____________________________________________________________________  [facility] - Preparing for Surgery Before surgery, you can play an important role.  Because skin is not sterile, your skin needs to be as free of germs as possible.  You can reduce the number of germs on your skin by washing with CHG (chlorahexidine gluconate) soap before surgery.  CHG is an antiseptic cleaner which kills germs and bonds with the skin to continue killing germs even after washing. Please DO NOT use if you have an allergy to CHG or antibacterial soaps.  If your skin becomes reddened/irritated stop using the CHG and inform your nurse when you arrive at Short Stay. Do not shave (including legs and underarms) for at least 48 hours prior to the first CHG shower.  You may shave your face/neck. Please follow these instructions carefully:  1.  Shower with CHG Soap the night before surgery and the  morning of Surgery.  2.  If you choose to wash your hair, wash your hair first as usual with your  normal  shampoo.  3.  After you shampoo, rinse your hair and body thoroughly to remove the  shampoo.  4.  Use CHG as you would any other liquid soap.  You can apply chg directly  to the skin and wash                       Gently  with a scrungie or clean washcloth.  5.  Apply the CHG Soap to your body ONLY FROM THE NECK DOWN.   Do not use on face/ open                           Wound or open sores. Avoid contact with eyes, ears mouth and genitals (private parts).                       Wash face,  Genitals (private parts) with your normal soap.             6.  Wash thoroughly, paying special attention to the area where your surgery  will be performed.  7.  Thoroughly rinse your body with warm water from the neck down.  8.  DO NOT shower/wash with your normal soap after using and rinsing off  the CHG Soap.                9.  Pat yourself dry with a clean towel.            10.  Wear clean pajamas.            11.  Place clean sheets on your bed the night of your first shower and do not  sleep with pets. Day of Surgery : Do not apply any lotions/deodorants the morning of surgery.  Please wear clean clothes to the hospital/surgery center.  FAILURE TO FOLLOW THESE INSTRUCTIONS MAY RESULT IN THE CANCELLATION OF YOUR SURGERY PATIENT SIGNATURE_________________________________  NURSE SIGNATURE__________________________________  ________________________________________________________________________

## 2016-11-20 HISTORY — PX: COLONOSCOPY: SHX174

## 2016-11-21 ENCOUNTER — Encounter (HOSPITAL_COMMUNITY)
Admission: RE | Admit: 2016-11-21 | Discharge: 2016-11-21 | Disposition: A | Discharge status: Home / Self Care | Payer: BLUE CROSS/BLUE SHIELD | Source: Ambulatory Visit | Attending: Surgery | Admitting: Surgery

## 2016-11-21 ENCOUNTER — Encounter (HOSPITAL_COMMUNITY): Payer: Self-pay

## 2016-11-21 DIAGNOSIS — K429 Umbilical hernia without obstruction or gangrene: Secondary | ICD-10-CM | POA: Diagnosis not present

## 2016-11-21 DIAGNOSIS — Z01812 Encounter for preprocedural laboratory examination: Secondary | ICD-10-CM | POA: Insufficient documentation

## 2016-11-21 DIAGNOSIS — R1033 Periumbilical pain: Secondary | ICD-10-CM | POA: Insufficient documentation

## 2016-11-21 DIAGNOSIS — J45909 Unspecified asthma, uncomplicated: Secondary | ICD-10-CM | POA: Insufficient documentation

## 2016-11-21 DIAGNOSIS — K759 Inflammatory liver disease, unspecified: Secondary | ICD-10-CM | POA: Insufficient documentation

## 2016-11-21 DIAGNOSIS — F329 Major depressive disorder, single episode, unspecified: Secondary | ICD-10-CM | POA: Diagnosis not present

## 2016-11-21 DIAGNOSIS — G500 Trigeminal neuralgia: Secondary | ICD-10-CM | POA: Diagnosis not present

## 2016-11-21 DIAGNOSIS — D259 Leiomyoma of uterus, unspecified: Secondary | ICD-10-CM | POA: Diagnosis not present

## 2016-11-21 HISTORY — DX: Depression, unspecified: F32.A

## 2016-11-21 HISTORY — DX: Anxiety disorder, unspecified: F41.9

## 2016-11-21 HISTORY — DX: Major depressive disorder, single episode, unspecified: F32.9

## 2016-11-21 HISTORY — DX: Inflammatory liver disease, unspecified: K75.9

## 2016-11-21 LAB — COMPREHENSIVE METABOLIC PANEL
ALK PHOS: 54 U/L (ref 38–126)
ALT: 24 U/L (ref 14–54)
AST: 21 U/L (ref 15–41)
Albumin: 3.9 g/dL (ref 3.5–5.0)
Anion gap: 7 (ref 5–15)
BUN: 15 mg/dL (ref 6–20)
CALCIUM: 9.1 mg/dL (ref 8.9–10.3)
CO2: 25 mmol/L (ref 22–32)
CREATININE: 0.69 mg/dL (ref 0.44–1.00)
Chloride: 105 mmol/L (ref 101–111)
Glucose, Bld: 81 mg/dL (ref 65–99)
Potassium: 3.7 mmol/L (ref 3.5–5.1)
Sodium: 137 mmol/L (ref 135–145)
Total Bilirubin: 0.4 mg/dL (ref 0.3–1.2)
Total Protein: 6.8 g/dL (ref 6.5–8.1)

## 2016-11-21 LAB — CBC WITH DIFFERENTIAL/PLATELET
Basophils Absolute: 0 10*3/uL (ref 0.0–0.1)
Basophils Relative: 1 %
EOS PCT: 7 %
Eosinophils Absolute: 0.4 10*3/uL (ref 0.0–0.7)
HCT: 38.8 % (ref 36.0–46.0)
HEMOGLOBIN: 13.2 g/dL (ref 12.0–15.0)
Lymphocytes Relative: 37 %
Lymphs Abs: 2.4 10*3/uL (ref 0.7–4.0)
MCH: 30.8 pg (ref 26.0–34.0)
MCHC: 34 g/dL (ref 30.0–36.0)
MCV: 90.7 fL (ref 78.0–100.0)
Monocytes Absolute: 0.5 10*3/uL (ref 0.1–1.0)
Monocytes Relative: 8 %
Neutro Abs: 3 10*3/uL (ref 1.7–7.7)
Neutrophils Relative %: 47 %
PLATELETS: 265 10*3/uL (ref 150–400)
RBC: 4.28 MIL/uL (ref 3.87–5.11)
RDW: 12.6 % (ref 11.5–15.5)
WBC: 6.3 10*3/uL (ref 4.0–10.5)

## 2016-11-21 LAB — PROTIME-INR
INR: 0.96
PROTHROMBIN TIME: 12.7 s (ref 11.4–15.2)

## 2016-11-21 LAB — HCG, SERUM, QUALITATIVE: Preg, Serum: NEGATIVE

## 2016-11-21 LAB — APTT: aPTT: 30 seconds (ref 24–36)

## 2016-11-22 LAB — HEMOGLOBIN A1C
HEMOGLOBIN A1C: 5 % (ref 4.8–5.6)
MEAN PLASMA GLUCOSE: 97 mg/dL

## 2016-11-29 ENCOUNTER — Ambulatory Visit (HOSPITAL_COMMUNITY): Payer: BLUE CROSS/BLUE SHIELD | Admitting: Anesthesiology

## 2016-11-29 ENCOUNTER — Ambulatory Visit (HOSPITAL_COMMUNITY)
Admission: RE | Admit: 2016-11-29 | Discharge: 2016-11-29 | Disposition: A | Discharge status: Home / Self Care | Payer: BLUE CROSS/BLUE SHIELD | Source: Ambulatory Visit | Attending: Surgery | Admitting: Surgery

## 2016-11-29 ENCOUNTER — Encounter (HOSPITAL_COMMUNITY)
Admission: RE | Disposition: A | Discharge status: Home / Self Care | Payer: Self-pay | Source: Ambulatory Visit | Attending: Surgery

## 2016-11-29 ENCOUNTER — Encounter (HOSPITAL_COMMUNITY): Payer: Self-pay | Admitting: *Deleted

## 2016-11-29 DIAGNOSIS — K811 Chronic cholecystitis: Secondary | ICD-10-CM | POA: Diagnosis not present

## 2016-11-29 DIAGNOSIS — Z79899 Other long term (current) drug therapy: Secondary | ICD-10-CM | POA: Insufficient documentation

## 2016-11-29 DIAGNOSIS — F329 Major depressive disorder, single episode, unspecified: Secondary | ICD-10-CM | POA: Insufficient documentation

## 2016-11-29 DIAGNOSIS — R109 Unspecified abdominal pain: Secondary | ICD-10-CM | POA: Diagnosis present

## 2016-11-29 HISTORY — PX: CHOLECYSTECTOMY: SHX55

## 2016-11-29 SURGERY — LAPAROSCOPIC CHOLECYSTECTOMY
Anesthesia: General | Site: Abdomen

## 2016-11-29 MED ORDER — MIDAZOLAM HCL 5 MG/5ML IJ SOLN
INTRAMUSCULAR | Status: DC | PRN
  Administered 2016-11-29: 2 mg via INTRAVENOUS

## 2016-11-29 MED ORDER — ONDANSETRON HCL 4 MG/2ML IJ SOLN
INTRAMUSCULAR | Status: DC | PRN
Start: 2016-11-29 — End: 2016-11-29
  Administered 2016-11-29: 4 mg via INTRAVENOUS

## 2016-11-29 MED ORDER — ONDANSETRON HCL 4 MG/2ML IJ SOLN
INTRAMUSCULAR | Status: AC
  Filled 2016-11-29: qty 2

## 2016-11-29 MED ORDER — SODIUM CHLORIDE 0.9% FLUSH: 3.0000 mL | INTRAVENOUS | Status: DC | PRN

## 2016-11-29 MED ORDER — SUCCINYLCHOLINE CHLORIDE 200 MG/10ML IV SOSY
PREFILLED_SYRINGE | INTRAVENOUS | Status: AC
  Filled 2016-11-29: qty 10

## 2016-11-29 MED ORDER — BUPIVACAINE HCL (PF) 0.25 % IJ SOLN
INTRAMUSCULAR | Status: AC
  Filled 2016-11-29: qty 30

## 2016-11-29 MED ORDER — DEXAMETHASONE SODIUM PHOSPHATE 10 MG/ML IJ SOLN
INTRAMUSCULAR | Status: AC
  Filled 2016-11-29: qty 1

## 2016-11-29 MED ORDER — LACTATED RINGERS IV SOLN
INTRAVENOUS | Status: DC | PRN
  Administered 2016-11-29: 14:00:00 via INTRAVENOUS

## 2016-11-29 MED ORDER — OXYCODONE HCL 5 MG/5ML PO SOLN
5.0000 mg | Freq: Once | ORAL | Status: DC | PRN
  Filled 2016-11-29: qty 5

## 2016-11-29 MED ORDER — SUGAMMADEX SODIUM 200 MG/2ML IV SOLN
INTRAVENOUS | Status: DC | PRN
  Administered 2016-11-29: 200 mg via INTRAVENOUS

## 2016-11-29 MED ORDER — OXYCODONE HCL 5 MG PO TABS
5.0000 mg | ORAL_TABLET | ORAL | Status: DC | PRN
  Administered 2016-11-29: 5 mg via ORAL
  Filled 2016-11-29: qty 1

## 2016-11-29 MED ORDER — HYDROMORPHONE HCL 1 MG/ML IJ SOLN
INTRAMUSCULAR | Status: DC
Start: 2016-11-29 — End: 2016-11-29
  Filled 2016-11-29: qty 1

## 2016-11-29 MED ORDER — MIDAZOLAM HCL 2 MG/2ML IJ SOLN
INTRAMUSCULAR | Status: AC
  Filled 2016-11-29: qty 2

## 2016-11-29 MED ORDER — ACETAMINOPHEN 650 MG RE SUPP
650.0000 mg | RECTAL | Status: DC | PRN
  Filled 2016-11-29: qty 1

## 2016-11-29 MED ORDER — CEFAZOLIN SODIUM-DEXTROSE 2-4 GM/100ML-% IV SOLN
INTRAVENOUS | Status: AC
  Filled 2016-11-29: qty 100

## 2016-11-29 MED ORDER — CEFAZOLIN SODIUM-DEXTROSE 2-4 GM/100ML-% IV SOLN
2.0000 g | INTRAVENOUS | Status: AC
  Administered 2016-11-29: 2 g via INTRAVENOUS
  Filled 2016-11-29: qty 100

## 2016-11-29 MED ORDER — FENTANYL CITRATE (PF) 250 MCG/5ML IJ SOLN
INTRAMUSCULAR | Status: AC
  Filled 2016-11-29: qty 5

## 2016-11-29 MED ORDER — ACETAMINOPHEN 325 MG PO TABS: 650.0000 mg | ORAL_TABLET | ORAL | Status: DC | PRN

## 2016-11-29 MED ORDER — LACTATED RINGERS IV SOLN
INTRAVENOUS | Status: DC | PRN
  Administered 2016-11-29: 1000 mL via INTRAVENOUS

## 2016-11-29 MED ORDER — LIDOCAINE 2% (20 MG/ML) 5 ML SYRINGE
INTRAMUSCULAR | Status: DC | PRN
  Administered 2016-11-29: 75 mg via INTRAVENOUS

## 2016-11-29 MED ORDER — DEXAMETHASONE SODIUM PHOSPHATE 10 MG/ML IJ SOLN
INTRAMUSCULAR | Status: DC | PRN
  Administered 2016-11-29: 10 mg via INTRAVENOUS

## 2016-11-29 MED ORDER — PROPOFOL 10 MG/ML IV BOLUS
INTRAVENOUS | Status: DC | PRN
  Administered 2016-11-29: 150 mg via INTRAVENOUS

## 2016-11-29 MED ORDER — CHLORHEXIDINE GLUCONATE CLOTH 2 % EX PADS: 6.0000 | MEDICATED_PAD | Freq: Once | CUTANEOUS | Status: DC

## 2016-11-29 MED ORDER — FENTANYL CITRATE (PF) 100 MCG/2ML IJ SOLN: 25.0000 ug | INTRAMUSCULAR | Status: DC | PRN

## 2016-11-29 MED ORDER — HYDROMORPHONE HCL 1 MG/ML IJ SOLN
0.2500 mg | INTRAMUSCULAR | Status: DC | PRN
  Administered 2016-11-29 (×2): 0.5 mg via INTRAVENOUS

## 2016-11-29 MED ORDER — BUPIVACAINE-EPINEPHRINE 0.25% -1:200000 IJ SOLN
INTRAMUSCULAR | Status: DC | PRN
  Administered 2016-11-29: 20 mL

## 2016-11-29 MED ORDER — MEPERIDINE HCL 50 MG/ML IJ SOLN: 6.2500 mg | INTRAMUSCULAR | Status: DC | PRN

## 2016-11-29 MED ORDER — FENTANYL CITRATE (PF) 100 MCG/2ML IJ SOLN
INTRAMUSCULAR | Status: DC | PRN
  Administered 2016-11-29: 50 ug via INTRAVENOUS
  Administered 2016-11-29 (×2): 100 ug via INTRAVENOUS

## 2016-11-29 MED ORDER — SODIUM CHLORIDE 0.9 % IV SOLN: 250.0000 mL | INTRAVENOUS | Status: DC | PRN

## 2016-11-29 MED ORDER — SODIUM CHLORIDE 0.9% FLUSH: 3.0000 mL | Freq: Two times a day (BID) | INTRAVENOUS | Status: DC

## 2016-11-29 MED ORDER — ROCURONIUM BROMIDE 10 MG/ML (PF) SYRINGE
PREFILLED_SYRINGE | INTRAVENOUS | Status: DC | PRN
  Administered 2016-11-29: 50 mg via INTRAVENOUS

## 2016-11-29 MED ORDER — PROPOFOL 10 MG/ML IV BOLUS
INTRAVENOUS | Status: AC
  Filled 2016-11-29: qty 20

## 2016-11-29 MED ORDER — DOCUSATE SODIUM 100 MG PO CAPS: 100.0000 mg | ORAL_CAPSULE | Freq: Two times a day (BID) | ORAL | 0 refills | Status: AC

## 2016-11-29 MED ORDER — OXYCODONE HCL 5 MG PO TABS: 5.0000 mg | ORAL_TABLET | Freq: Once | ORAL | Status: DC | PRN

## 2016-11-29 MED ORDER — HYDROMORPHONE HCL 1 MG/ML IJ SOLN
INTRAMUSCULAR | Status: AC
  Filled 2016-11-29: qty 1

## 2016-11-29 MED ORDER — PROMETHAZINE HCL 25 MG/ML IJ SOLN: 6.2500 mg | INTRAMUSCULAR | Status: DC | PRN

## 2016-11-29 MED ORDER — LIP MEDEX EX OINT
TOPICAL_OINTMENT | CUTANEOUS | Status: AC
  Filled 2016-11-29: qty 7

## 2016-11-29 MED ORDER — ROCURONIUM BROMIDE 50 MG/5ML IV SOSY
PREFILLED_SYRINGE | INTRAVENOUS | Status: AC
  Filled 2016-11-29: qty 5

## 2016-11-29 MED ORDER — HYDROCODONE-ACETAMINOPHEN 5-325 MG PO TABS: 1.0000 | ORAL_TABLET | Freq: Four times a day (QID) | ORAL | 0 refills | Status: DC | PRN

## 2016-11-29 MED ORDER — LIDOCAINE 2% (20 MG/ML) 5 ML SYRINGE
INTRAMUSCULAR | Status: AC
  Filled 2016-11-29: qty 5

## 2016-11-29 MED ORDER — SUGAMMADEX SODIUM 200 MG/2ML IV SOLN
INTRAVENOUS | Status: AC
  Filled 2016-11-29: qty 2

## 2016-11-29 MED ORDER — LACTATED RINGERS IV SOLN: INTRAVENOUS | Status: DC

## 2016-11-29 MED FILL — HYDROCODON-APAP 5-325: 5-325 | 7 days supply | Qty: 30 | Fill #0

## 2016-11-29 SURGICAL SUPPLY — 33 items
APPLIER CLIP ROT 10 11.4 M/L (STAPLE) ×2
CABLE HIGH FREQUENCY MONO STRZ (ELECTRODE) ×2 IMPLANT
CHLORAPREP W/TINT 26ML (MISCELLANEOUS) ×2 IMPLANT
CLIP APPLIE ROT 10 11.4 M/L (STAPLE) ×1 IMPLANT
COVER MAYO STAND STRL (DRAPES) IMPLANT
COVER SURGICAL LIGHT HANDLE (MISCELLANEOUS) ×2 IMPLANT
DECANTER SPIKE VIAL GLASS SM (MISCELLANEOUS) ×2 IMPLANT
DERMABOND ADVANCED (GAUZE/BANDAGES/DRESSINGS) ×1
DERMABOND ADVANCED .7 DNX12 (GAUZE/BANDAGES/DRESSINGS) ×1 IMPLANT
DEVICE PMI PUNCTURE CLOSURE (MISCELLANEOUS) ×2 IMPLANT
DRAPE C-ARM 42X120 X-RAY (DRAPES) IMPLANT
ELECT REM PT RETURN 9FT ADLT (ELECTROSURGICAL) ×2
ELECTRODE REM PT RTRN 9FT ADLT (ELECTROSURGICAL) ×1 IMPLANT
GLOVE BIO SURGEON STRL SZ 6 (GLOVE) ×2 IMPLANT
GLOVE INDICATOR 6.5 STRL GRN (GLOVE) ×2 IMPLANT
GOWN STRL REUS W/TWL LRG LVL3 (GOWN DISPOSABLE) ×4 IMPLANT
GOWN STRL REUS W/TWL XL LVL3 (GOWN DISPOSABLE) ×2 IMPLANT
HEMOSTAT SNOW SURGICEL 2X4 (HEMOSTASIS) IMPLANT
IRRIG SUCT STRYKERFLOW 2 WTIP (MISCELLANEOUS) ×2
IRRIGATION SUCT STRKRFLW 2 WTP (MISCELLANEOUS) ×1 IMPLANT
KIT BASIN OR (CUSTOM PROCEDURE TRAY) ×2 IMPLANT
NEEDLE INSUFFLATION 14GA 120MM (NEEDLE) ×2 IMPLANT
POUCH SPECIMEN RETRIEVAL 10MM (ENDOMECHANICALS) ×2 IMPLANT
SCISSORS LAP 5X35 DISP (ENDOMECHANICALS) ×2 IMPLANT
SET CHOLANGIOGRAPH MIX (MISCELLANEOUS) IMPLANT
SLEEVE XCEL OPT CAN 5 100 (ENDOMECHANICALS) ×4 IMPLANT
SUT MNCRL AB 4-0 PS2 18 (SUTURE) ×2 IMPLANT
TOWEL OR 17X26 10 PK STRL BLUE (TOWEL DISPOSABLE) ×2 IMPLANT
TOWEL OR NON WOVEN STRL DISP B (DISPOSABLE) ×2 IMPLANT
TRAY LAPAROSCOPIC (CUSTOM PROCEDURE TRAY) ×2 IMPLANT
TROCAR BLADELESS OPT 5 100 (ENDOMECHANICALS) ×2 IMPLANT
TROCAR XCEL 12X100 BLDLESS (ENDOMECHANICALS) ×2 IMPLANT
TUBING INSUF HEATED (TUBING) ×2 IMPLANT

## 2016-11-29 NOTE — Transfer of Care (Signed)
Immediate Anesthesia Transfer of Care Note  Patient: Sara Bishop  Procedure(s) Performed: Procedure(s): LAPAROSCOPIC CHOLECYSTECTOMY (N/A)  Patient Location: PACU  Anesthesia Type:General  Level of Consciousness: awake and alert   Airway & Oxygen Therapy: Patient Spontanous Breathing and Patient connected to face mask oxygen  Post-op Assessment: Report given to RN and Post -op Vital signs reviewed and stable  Post vital signs: Reviewed and stable  Last Vitals:  Vitals:   11/29/16 1253  BP: (!) 116/54  Pulse: 66  Resp: 16  Temp: 36.7 C    Last Pain:  Vitals:   11/29/16 1253  TempSrc: Oral      Patients Stated Pain Goal: 3 ([id number])  Complications: No apparent anesthesia complications

## 2016-11-29 NOTE — Anesthesia Procedure Notes (Signed)
Procedure Name: Intubation Date/Time: 11/29/2016 3:24 PM Performed by: Lollie Sails Pre-anesthesia Checklist: Patient identified, Emergency Drugs available, Suction available, Patient being monitored and Timeout performed Patient Re-evaluated:Patient Re-evaluated prior to inductionOxygen Delivery Method: Circle system utilized Preoxygenation: Pre-oxygenation with 100% oxygen Intubation Type: IV induction Ventilation: Mask ventilation without difficulty Laryngoscope Size: Miller and 2 Grade View: Grade I Tube type: Oral Tube size: 7.5 mm Number of attempts: 1 Airway Equipment and Method: Stylet Placement Confirmation: ETT inserted through vocal cords under direct vision,  positive ETCO2 and breath sounds checked- equal and bilateral Secured at: 22 cm Tube secured with: Tape Dental Injury: Teeth and Oropharynx as per pre-operative assessment

## 2016-11-29 NOTE — Discharge Instructions (Signed)
General Anesthesia, Adult, Care After These instructions provide you with information about caring for yourself after your procedure. Your health care provider may also give you more specific instructions. Your treatment has been planned according to current medical practices, but problems sometimes occur. Call your health care provider if you have any problems or questions after your procedure. What can I expect after the procedure? After the procedure, it is common to have:  Vomiting.  A sore throat.  Mental slowness. It is common to feel:  Nauseous.  Cold or shivery.  Sleepy.  Tired.  Sore or achy, even in parts of your body where you did not have surgery. Follow these instructions at home: For at least 24 hours after the procedure:  Do not:  Participate in activities where you could fall or become injured.  Drive.  Use heavy machinery.  Drink alcohol.  Take sleeping pills or medicines that cause drowsiness.  Make important decisions or sign legal documents.  Take care of children on your own.  Rest. Eating and drinking  If you vomit, drink water, juice, or soup when you can drink without vomiting.  Drink enough fluid to keep your urine clear or pale yellow.  Make sure you have little or no nausea before eating solid foods.  Follow the diet recommended by your health care provider. General instructions  Have a responsible adult stay with you until you are awake and alert.  Return to your normal activities as told by your health care provider. Ask your health care provider what activities are safe for you.  Take over-the-counter and prescription medicines only as told by your health care provider.  If you smoke, do not smoke without supervision.  Keep all follow-up visits as told by your health care provider. This is important. Contact a health care provider if:  You continue to have nausea or vomiting at home, and medicines are not helpful.  You  cannot drink fluids or start eating again.  You cannot urinate after 8-12 hours.  You develop a skin rash.  You have fever.  You have increasing redness at the site of your procedure. Get help right away if:  You have difficulty breathing.  You have chest pain.  You have unexpected bleeding.  You feel that you are having a life-threatening or urgent problem. This information is not intended to replace advice given to you by your health care provider. Make sure you discuss any questions you have with your health care provider. Document Released: 02/12/2001 Document Revised: 04/10/2016 Document Reviewed: 10/21/2015 Elsevier Interactive Patient Education  2017 [place] ______[employer] LAPAROSCOPIC SURGERY: POST OP INSTRUCTIONS Always review your discharge instruction sheet given to you by the facility where your surgery was performed. IF YOU HAVE DISABILITY OR FAMILY LEAVE FORMS, YOU MUST BRING THEM TO THE OFFICE FOR PROCESSING.   DO NOT GIVE THEM TO YOUR DOCTOR.  1. A prescription for pain medication may be given to you upon discharge.  Take your pain medication as prescribed, if needed.  If narcotic pain medicine is not needed, then you may take acetaminophen (Tylenol) or ibuprofen (Advil) as needed. 2. Take your usually prescribed medications unless otherwise directed. 3. If you need a refill on your pain medication, please contact your pharmacy.  They will contact our office to request authorization. Prescriptions will not be filled after 5pm or on week-ends. 4. You should follow a light diet the first few days after arrival home,  such as soup and crackers, etc.  Be sure to include lots of fluids daily. 5. Most patients will experience some swelling and bruising in the area of the incisions.  Ice packs will help.  Swelling and bruising can take several days to resolve.  6. It is common to experience some constipation if taking pain medication  after surgery.  Increasing fluid intake and taking a stool softener (such as Colace) will usually help or prevent this problem from occurring.  A mild laxative (Milk of Magnesia or Miralax) should be taken according to package instructions if there are no bowel movements after 48 hours. 7. Unless discharge instructions indicate otherwise, you may remove your bandages 24-48 hours after surgery, and you may shower at that time.  You may have steri-strips (small skin tapes) in place directly over the incision.  These strips should be left on the skin for 7-10 days.  If your surgeon used skin glue on the incision, you may shower in 24 hours.  The glue will flake off over the next 2-3 weeks.  Any sutures or staples will be removed at the office during your follow-up visit. 8. ACTIVITIES:  You may resume regular (light) daily activities beginning the next day--such as daily self-care, walking, climbing stairs--gradually increasing activities as tolerated.  You may have sexual intercourse when it is comfortable.  Refrain from any heavy lifting or straining until approved by your doctor. a. You may drive when you are no longer taking prescription pain medication, you can comfortably wear a seatbelt, and you can safely maneuver your car and apply brakes. b. RETURN TO WORK:  _1 week______________________________________________________ 9. You should see your doctor in the office for a follow-up appointment approximately 2-3 weeks after your surgery.  Make sure that you call for this appointment within a day or two after you arrive home to insure a convenient appointment time. 10. OTHER INSTRUCTIONS: __________________________________________________________________________________________________________________________ __________________________________________________________________________________________________________________________ WHEN TO CALL YOUR DOCTOR: 1. Fever over 101.0 2. Inability to  urinate 3. Continued bleeding from incision. 4. Increased pain, redness, or drainage from the incision. 5. Increasing abdominal pain  The clinic staff is available to answer your questions during regular business hours.  Please dont hesitate to call and ask to speak to one of the nurses for clinical concerns.  If you have a medical emergency, go to the nearest emergency room or call 911.  A surgeon from [facility] is always on call at the hospital. [address] [place], [place], [place]  [address] ? [address], [place], [place]   [address] [phone] ? [phone] ? FAX [phone] Web site: [email or web address]

## 2016-11-29 NOTE — Op Note (Signed)
Operative Note  Sara Bishop 41 y.o. female [id number]  11/29/2016  Surgeon: Clovis Riley   Assistant: none  Procedure performed: Laparoscopic Cholecystectomy  Preop diagnosis: biliary dyskinesia Post-op diagnosis/intraop findings: same  Specimens: gallbladder  EBL: [id number]  Complications: none  Description of procedure: After obtaining informed consent the patient was brought to the operating room. Prophylactic antibiotics and subcutaneous heparin were administered. SCD's were applied. General endotracheal anesthesia was initiated and a formal time-out was performed. The abdomen was prepped and draped in the usual sterile fashion and the abdomen was entered using an infraumbilical veress needle after instilling the site with local. Insufflation to 60mmHg was obtained, 34mm trocar and camera introduced and gross inspection revealed no evidence of injury from our entry or other intraabdominal abnormalities. Two 23mm trocars were introduced in the right midclavicular and right anterior axillary lines under direct visualization and following infiltration with local. An omental adhesion to the anterior abdominal wall at the location of a previous epigastric hernia repair was taken down bluntly. A 37mm trocar was placed in the epigastrium. The gallbladder was retracted cephalad and the infundibulum was retracted laterally. A combination of hook electrocautery and blunt dissection was utilized to clear the peritoneum from the neck and cystic duct, circumferentially isolating the cystic artery and cystic duct and lifting the gallbladder from the cystic plate. The artery was diminutive and was divided with cautery after the critical view of safety was achieved with the cystic artery, cystic duct, and liver bed visualized between them with no other structures. The cystic duct was ligated with three clips on the proximal end. The gallbladder was dissected from the liver plate using electrocautery. Once  freed the gallbladder was placed in an endocatch bag and removed through the epigastric trocar site. The liver bed was inspected and confirmed hemostatic as was the omental adhesion previously mentioned. The clips were well opposed without any bile leak from the cystic duct remnant or the liver bed. The 44mm trocar site in the epigastrium was closed with a 0 vicryl in the fascia under direct visualization using a PMI device. The abdomen was desufflated and all trocars removed. The skin incisions were closed with running subcuticular monocryl and Dermabond. The patient was awakened, extubated and transported to the recovery room in stable condition.   All counts were correct at the completion of the case.

## 2016-11-29 NOTE — H&P (Signed)
Sara Bishop Patient #: [id number] DOB: 1976-06-17 Single / Language: English Female  History of Present Illness  Patient words: This is a very pleasant and otherwise healthy woman who has had issues with abdominal pain since December of last year. She was initially seen by gastroenterologist and underwent upper endoscopy which revealed gastritis. She was put on a proton pump inhibitor and Carafate which did improve her symptoms. However they returned shortly, even despite an increase in her dosage of Prilosec. Her pain is in the epigastrium and subxiphoid region and radiates straight through to her back. It is sharp in quality. This is exacerbated by fatty foods and large meals. She has recently been on a diet of soup and crackers and tea, which has afforded her some relief. This is associated with nausea and rare emesis. No fevers but she does endorse chills. She has had issues with diarrhea in the past but currently reports constipation. Her only prior abdominal surgical history is a supraumbilical hernia repair. She underwent a CT scan of the abdomen and pelvis which was negative for any abnormality. She also had a HIDA scan that did reveal a gallbladder ejection fraction of 9% consistent with biliary dyskinesia.  She is otherwise healthy. She works as an Higher education careers adviser.  Allergies  No Known Drug Allergies   Medication History  Levocetirizine Dihydrochloride (5MG  Tablet, Oral daily) Active. Escitalopram Oxalate (10MG  Tablet, Oral daily) Active. Dicyclomine HCl (10MG  Capsule, Oral daily) Active. Omeprazole (40MG  Capsule DR, Oral daily) Active.  Review of Systems All other systems negative   There were no vitals filed for this visit.   Physical Exam  The physical exam findings are as follows: Note:She is alert and oriented in no distress Unlabored respirations, clear bilaterally extraocular motion intact, anicteric moist mucus membranes, good dentition Neck  without mass or thyromegaly Regular rate and rhythm with palpable distal pulses, no peripheral edema Abdomen is soft nontender nondistended. There is a vertical supraumbilical umbilical scar about 2 cm in length. No hernia or mass, no organomegaly Extremities are warm without deformity Neuro grossly intact, normal gait Psychiatric normal mood and affect    Assessment & Plan   BILIARY DYSKINESIA (Principal Diagnosis) (K82.8) Story: Gallbladder EF 9% on recent HIDA. CT negative for abnormality. will plan for lap chole. discussed nature of surgery and risks of bleeding, infection, pain, scarring, injury to intraabdominal structures particularly common bile duct and sequelae.

## 2016-11-29 NOTE — Anesthesia Preprocedure Evaluation (Signed)
Anesthesia Evaluation  Patient identified by MRN, date of birth, ID band Patient awake    Reviewed: Allergy & Precautions, NPO status , Patient's Chart, lab work & pertinent test results  Airway Mallampati: II  TM Distance: >3 FB Neck ROM: Full    Dental no notable dental hx.    Pulmonary asthma ,    Pulmonary exam normal breath sounds clear to auscultation       Cardiovascular negative cardio ROS Normal cardiovascular exam Rhythm:Regular Rate:Normal     Neuro/Psych PSYCHIATRIC DISORDERS Anxiety Depression negative neurological ROS  negative psych ROS   GI/Hepatic negative GI ROS, Neg liver ROS, (+) Hepatitis -  Endo/Other  negative endocrine ROS  Renal/GU negative Renal ROS     Musculoskeletal negative musculoskeletal ROS (+)   Abdominal   Peds  Hematology negative hematology ROS (+)   Anesthesia Other Findings   Reproductive/Obstetrics negative OB ROS                             Anesthesia Physical Anesthesia Plan  ASA: II  Anesthesia Plan: General   Post-op Pain Management:    Induction: Intravenous  Airway Management Planned: Oral ETT  Additional Equipment:   Intra-op Plan:   Post-operative Plan: Extubation in OR  Informed Consent: I have reviewed the patients History and Physical, chart, labs and discussed the procedure including the risks, benefits and alternatives for the proposed anesthesia with the patient or authorized representative who has indicated his/her understanding and acceptance.   Dental advisory given  Plan Discussed with: CRNA  Anesthesia Plan Comments:         Anesthesia Quick Evaluation

## 2016-11-29 NOTE — Anesthesia Postprocedure Evaluation (Signed)
Anesthesia Post Note  Patient: Sara Bishop  Procedure(s) Performed: Procedure(s) (LRB): LAPAROSCOPIC CHOLECYSTECTOMY (N/A)  Patient location during evaluation: PACU Anesthesia Type: General Level of consciousness: sedated and patient cooperative Pain management: pain level controlled Vital Signs Assessment: post-procedure vital signs reviewed and stable Respiratory status: spontaneous breathing Cardiovascular status: stable Anesthetic complications: no       Last Vitals:  Vitals:   11/29/16 1715 11/29/16 1730  BP: 119/77 125/79  Pulse: 67 64  Resp: 12 12  Temp: 36.7 C 36.7 C    Last Pain:  Vitals:   11/29/16 1744  TempSrc:   PainSc: [place]

## 2016-11-30 ENCOUNTER — Encounter (HOSPITAL_COMMUNITY): Payer: Self-pay | Admitting: Surgery

## 2017-09-14 ENCOUNTER — Telehealth: Payer: Self-pay | Admitting: Internal Medicine

## 2017-09-19 ENCOUNTER — Encounter: Payer: Self-pay | Admitting: Internal Medicine

## 2017-09-19 NOTE — Telephone Encounter (Signed)
Dr.Gessner reviewed records and accepted for patient to be scheduled for an office visit. Left message for patient to call back and schedule appointment.

## 2017-11-12 ENCOUNTER — Encounter: Payer: Self-pay | Admitting: Internal Medicine

## 2017-11-12 ENCOUNTER — Ambulatory Visit: Payer: BLUE CROSS/BLUE SHIELD | Admitting: Internal Medicine

## 2017-11-12 VITALS — BP 98/64 | HR 88 | Ht 64.0 in | Wt 154.1 lb

## 2017-11-12 DIAGNOSIS — Z62819 Personal history of unspecified abuse in childhood: Secondary | ICD-10-CM

## 2017-11-12 DIAGNOSIS — K30 Functional dyspepsia: Secondary | ICD-10-CM

## 2017-11-12 DIAGNOSIS — K588 Other irritable bowel syndrome: Secondary | ICD-10-CM | POA: Diagnosis not present
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
## 2017-11-12 NOTE — Patient Instructions (Signed)
  We are going to refer you to [facility] , handout provided today.  We will be in touch about this appointment.   I appreciate the opportunity to care for you. Silvano Rusk, MD, [facility]

## 2017-11-15 ENCOUNTER — Ambulatory Visit: Payer: BLUE CROSS/BLUE SHIELD | Admitting: Allergy

## 2017-11-15 ENCOUNTER — Encounter: Payer: Self-pay | Admitting: Allergy

## 2017-11-15 VITALS — BP 110/75 | HR 79 | Temp 97.8°F | Resp 16 | Ht 64.0 in | Wt 157.8 lb

## 2017-11-15 DIAGNOSIS — Z91018 Allergy to other foods: Secondary | ICD-10-CM

## 2017-11-15 DIAGNOSIS — J4520 Mild intermittent asthma, uncomplicated: Secondary | ICD-10-CM

## 2017-11-15 DIAGNOSIS — J3089 Other allergic rhinitis: Secondary | ICD-10-CM | POA: Diagnosis not present
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
## 2017-11-15 NOTE — Patient Instructions (Addendum)
Food allergy    - skin prick testing today is positive to milk, pecan, almond and [place] nut.  At this time would take dairy (keep baked milk products in diet) and tree nuts out of diet.      - serum IgE testing was positive to wheat and would avoid this in diet as well    - have access to self-injectable epinephrine Wynona Luna) 0.3mg  at all times    - follow emergency action plan in case of allergic reaction  Allergic rhinitis     - at this time continue your allergy shots with [facility].  Ask how many doses you have left in your current vials and let us know.   We will obtain records from [place] and pending this will be able to make new vials for your to continue allergy shots here at our office.       - continue as needed use of OTC allergy medications like Zyrtec, Xyzal or Allegra  Asthma   - have access to albuterol inhaler 2 puffs every 4-6 hours as needed for cough/wheeze/shortness of breath/chest tightness.  May use 15-20 minutes prior to activity.   Monitor frequency of use.      - at this time symptoms appear to be well controlled with as needed albuterol  Follow-up 6 months or sooner if needed

## 2017-11-21 ENCOUNTER — Telehealth: Payer: Self-pay

## 2017-11-21 NOTE — Telephone Encounter (Signed)
-----   Message from Patti E [place], [place] sent at 11/12/2017  1:50 PM EST ----- Per Dr Carlean Purl see office note from 11/12/17 and please refer patient to [facility] functional disorder clinic.  Thank you.

## 2017-11-21 NOTE — Telephone Encounter (Signed)
Referral faxed to [facility] to functional bowel clinic

## 2017-12-11 NOTE — Telephone Encounter (Signed)
Patient has been scheduled with Dr. Denice Paradise for 02/13/18 at 9:20

## 2018-08-30 ENCOUNTER — Ambulatory Visit: Payer: BC Managed Care – PPO | Admitting: Psychiatry

## 2018-08-30 DIAGNOSIS — F3289 Other specified depressive episodes: Secondary | ICD-10-CM | POA: Diagnosis not present

## 2018-08-30 DIAGNOSIS — F3281 Premenstrual dysphoric disorder: Secondary | ICD-10-CM

## 2018-08-30 MED ORDER — ESCITALOPRAM OXALATE 10 MG PO TABS: 10.0000 mg | ORAL_TABLET | Freq: Every day | ORAL | 2 refills | Status: DC

## 2018-08-30 NOTE — Progress Notes (Signed)
[facility]  Patient ID: Sara Bishop,  MRN: [id number]  PCP: Sara Lass, MD  Date of Evaluation: 08/30/2018 Time spent:20 minutes   HISTORY/CURRENT STATUS: HPI patient is a 42 year old Hispanic female.  She was seen last June of this year.  She was having depression during her menses and Sara Bishop found that the oral contraceptive pill worsen things will get his changed her from Cymbalta to his overall and using it during her menstrual cycle only.  Patient is currently doing well.  She is on Lexapro 10 mg a day.  Her last menses was normal.  We will follow her ovulation.  Individual Medical History/ Review of Systems: Changes? :No  Allergies: Iohexol and Nsaids  Current Medications:  Current Outpatient Medications:  .  acetaminophen (TYLENOL) 500 MG tablet, Take 500 mg by mouth daily as needed for moderate pain or headache., Disp: , Rfl:  .  albuterol (PROVENTIL HFA;VENTOLIN HFA) 108 (90 BASE) MCG/ACT inhaler, Inhale 2 puffs into the lungs every 6 (six) hours as needed for wheezing or shortness of breath., Disp: , Rfl:  .  dicyclomine (BENTYL) 10 MG capsule, Take 10 mg by mouth 3 (three) times daily as needed for spasms., Disp: , Rfl: 3 .  escitalopram (LEXAPRO) 10 MG tablet, Take 1 tablet (10 mg total) by mouth daily., Disp: 30 tablet, Rfl: 2 .  levocetirizine (XYZAL) 5 MG tablet, Take 5 mg by mouth every evening., Disp: , Rfl: 2 .  Probiotic Product (ALIGN) 4 MG CAPS, Take 4 mg by mouth at bedtime., Disp: , Rfl:  .  sucralfate (CARAFATE) 1 g tablet, Take 1 g by mouth as needed. , Disp: , Rfl: 5 .  traMADol (ULTRAM) 50 MG tablet, Take 1 tablet by mouth every 6 (six) hours as needed for pain., Disp: , Rfl: 0 Medication Side Effects: None  Family Medical/ Social History: Changes? No  MENTAL HEALTH EXAM:  There were no vitals taken for this visit.There is no height or weight on file to calculate BMI.  General Appearance: Casual  Eye Contact:  Good  Speech:  Normal  Rate  Volume:  Normal  Mood:  Euthymic  Affect:  Appropriate  Thought Process:  Linear  Orientation:  Full (Time, Place, and Person)  Thought Content: Logical   Suicidal Thoughts:  No  Homicidal Thoughts:  No  Memory:  normal  Judgement:  Good  Insight:  Good  Psychomotor Activity:  Normal  Concentration:  Concentration: Good  Recall:  Good  Fund of Knowledge: Good  Language: Good  Akathisia:  NA  AIMS (if indicated): na  Assets:  Physical Health  ADL's:  Intact  Cognition: WNL  Prognosis:  Good    DIAGNOSES:    ICD-10-CM   1. Premenstrual dysphoric syndrome F32.89     RECOMMENDATIONS: Sara Bishop we will continue her current medicine regimen I will see her again in 3 months.  She knows to call if she has any problems    [name], PA-C

## 2018-09-18 ENCOUNTER — Telehealth: Payer: Self-pay | Admitting: Psychiatry

## 2018-09-18 MED ORDER — ESCITALOPRAM OXALATE 20 MG PO TABS: 20.0000 mg | ORAL_TABLET | Freq: Every day | ORAL | 1 refills | Status: DC

## 2018-09-18 NOTE — Telephone Encounter (Signed)
Pt. Wants to increase her lexapro. Will increase from 10mg /day to 20mg ;day

## 2018-09-26 ENCOUNTER — Ambulatory Visit: Payer: Self-pay | Admitting: Allergy

## 2018-10-16 ENCOUNTER — Other Ambulatory Visit: Payer: Self-pay

## 2018-10-16 MED ORDER — ESCITALOPRAM OXALATE 20 MG PO TABS: 20.0000 mg | ORAL_TABLET | Freq: Every day | ORAL | 0 refills | Status: DC

## 2018-11-13 ENCOUNTER — Encounter: Payer: Self-pay | Admitting: Emergency Medicine

## 2018-11-13 DIAGNOSIS — F3281 Premenstrual dysphoric disorder: Secondary | ICD-10-CM | POA: Insufficient documentation

## 2018-12-04 ENCOUNTER — Ambulatory Visit: Payer: BC Managed Care – PPO | Admitting: Psychiatry

## 2018-12-06 ENCOUNTER — Ambulatory Visit: Payer: BC Managed Care – PPO | Admitting: Psychiatry

## 2018-12-06 DIAGNOSIS — F3281 Premenstrual dysphoric disorder: Secondary | ICD-10-CM | POA: Diagnosis not present

## 2018-12-06 MED ORDER — ESCITALOPRAM OXALATE 10 MG PO TABS: ORAL_TABLET | ORAL | 3 refills | Status: DC

## 2018-12-06 MED ORDER — ESCITALOPRAM OXALATE 20 MG PO TABS: 20.0000 mg | ORAL_TABLET | Freq: Every day | ORAL | 0 refills | Status: DC

## 2018-12-06 NOTE — Progress Notes (Signed)
[facility]  Patient ID: Sara Bishop,  MRN: [id number]  PCP: [name], MD  Date of Evaluation: 12/06/2018 Time spent:20 minutes  Chief Complaint:   HISTORY/CURRENT STATUS: HPI patient was last seen October 2019.  Suffers with premenstrual dysthymic disorder.  She is on Lexapro 20 mg every day. Lexapro is doing a good job of controlling her dysphoria.  She still states that she could use a little bit more help with the dysphoria.  She also feels like she is gaining weight on the Lexapro.  Individual Medical History/ Review of Systems: Changes? :No   Allergies: Iohexol and Nsaids  Current Medications:  Current Outpatient Medications:  .  acetaminophen (TYLENOL) 500 MG tablet, Take 500 mg by mouth daily as needed for moderate pain or headache., Disp: , Rfl:  .  albuterol (PROVENTIL HFA;VENTOLIN HFA) 108 (90 BASE) MCG/ACT inhaler, Inhale 2 puffs into the lungs every 6 (six) hours as needed for wheezing or shortness of breath., Disp: , Rfl:  .  dicyclomine (BENTYL) 10 MG capsule, Take 10 mg by mouth 3 (three) times daily as needed for spasms., Disp: , Rfl: 3 .  DULoxetine (CYMBALTA) 30 MG capsule, Take 30 mg by mouth daily., Disp: , Rfl:  .  escitalopram (LEXAPRO) 10 MG tablet, Take 10 mg by mouth daily., Disp: , Rfl:  .  escitalopram (LEXAPRO) 20 MG tablet, Take 1 tablet (20 mg total) by mouth daily., Disp: 90 tablet, Rfl: 0 .  levocetirizine (XYZAL) 5 MG tablet, Take 5 mg by mouth every evening., Disp: , Rfl: 2 .  Probiotic Product (ALIGN) 4 MG CAPS, Take 4 mg by mouth at bedtime., Disp: , Rfl:  .  sertraline (ZOLOFT) 25 MG tablet, Take 25 mg by mouth daily., Disp: , Rfl:  .  sucralfate (CARAFATE) 1 g tablet, Take 1 g by mouth as needed. , Disp: , Rfl: 5 .  traMADol (ULTRAM) 50 MG tablet, Take 1 tablet by mouth every 6 (six) hours as needed for pain., Disp: , Rfl: 0 Medication Side Effects: none  Family Medical/ Social History: Changes? No  MENTAL HEALTH  EXAM:  There were no vitals taken for this visit.There is no height or weight on file to calculate BMI.  General Appearance: Casual  Eye Contact:  Good  Speech:  Normal Rate  Volume:  Normal  Mood:  Euthymic  Affect:  Appropriate  Thought Process:  Linear  Orientation:  Full (Time, Place, and Person)  Thought Content: Logical   Suicidal Thoughts:  No  Homicidal Thoughts:  No  Memory:  WNL  Judgement:  Good  Insight:  Good  Psychomotor Activity:  Normal  Concentration:  Concentration: Good  Recall:  Good  Fund of Knowledge: Good  Language: Good  Assets:  Desire for Improvement  ADL's:  Intact  Cognition: WNL  Prognosis:  Good    DIAGNOSES: No diagnosis found.  Receiving Psychotherapy: No    RECOMMENDATIONS: Patient will increase her Lexapro by 5 mg 5 days at ovulation and 5 days at her menses.  She is given a prescription for 20 mg a day 1 a day she is given a prescription for 10 mg to take one half of the 5 days at ovulation and 5 days of menses She is to return in 3 months.   [name], PA-C

## 2019-01-09 ENCOUNTER — Ambulatory Visit: Payer: BC Managed Care – PPO | Admitting: Internal Medicine

## 2019-01-09 ENCOUNTER — Encounter: Payer: Self-pay | Admitting: Internal Medicine

## 2019-01-09 VITALS — BP 106/72 | HR 73 | Ht 64.0 in | Wt 173.5 lb

## 2019-01-09 DIAGNOSIS — K594 Anal spasm: Secondary | ICD-10-CM

## 2019-01-09 DIAGNOSIS — K581 Irritable bowel syndrome with constipation: Secondary | ICD-10-CM | POA: Diagnosis not present

## 2019-01-09 NOTE — Patient Instructions (Signed)
  We are giving you samples of IBgard to try when you have your aches per Dr Carlean Purl. This is over the counter.   I appreciate the opportunity to care for you. Silvano Rusk, MD, [facility]
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
## 2019-02-25 ENCOUNTER — Other Ambulatory Visit: Payer: Self-pay | Admitting: Physician Assistant

## 2019-02-25 ENCOUNTER — Telehealth: Payer: Self-pay | Admitting: Psychiatry

## 2019-02-25 NOTE — Telephone Encounter (Signed)
Obviously it doesn't come in a 25 mg lexapro, looks like Lissa Hoard has been giving 20 mg and a 10 mg?  Please review

## 2019-02-25 NOTE — Telephone Encounter (Signed)
Let's do 20 mg daily, and add a 5 mg daily for the 5-7 days before her period.  That way, no cutting of pills involved.  The original note above looks like pt would like to take 25mg  daily.  But that won't really help the PMDD if the same dose is given every day.  It has to be increased before her period for it to work for that, and then decreased back to original dose once her period starts.  I'll pend orders, if you'd call her and explain above I appreciate it.

## 2019-02-25 NOTE — Telephone Encounter (Signed)
Had to RS her appt to 04/01/19.  She needs refill of her Lexapro.  But she needs 25 mg.  She has been taking 20mg  1/d  With 1/2 10mg  when in her menses.  It has done well so would just like the 25 mg so she doesn't have to cut the pills.  [facility] to [facility] [address]

## 2019-02-26 NOTE — Telephone Encounter (Signed)
Left voicemail to call back to review providers note.

## 2019-03-03 NOTE — Telephone Encounter (Signed)
Left voicemail to make sure she received message last week, instructed to call back with specific instructions.

## 2019-03-03 NOTE — Telephone Encounter (Signed)
Pt returning your call from today. Please contact Pt.

## 2019-03-03 NOTE — Telephone Encounter (Signed)
Tammy can you give the return calls to [facility] please

## 2019-03-05 ENCOUNTER — Telehealth: Payer: Self-pay | Admitting: Psychiatry

## 2019-03-06 NOTE — Telephone Encounter (Signed)
error 

## 2019-03-07 ENCOUNTER — Ambulatory Visit: Payer: BC Managed Care – PPO | Admitting: Psychiatry

## 2019-03-14 MED ORDER — ESCITALOPRAM OXALATE 20 MG PO TABS: 20.0000 mg | ORAL_TABLET | Freq: Every day | ORAL | 0 refills | Status: DC

## 2019-03-14 MED ORDER — ESCITALOPRAM OXALATE 5 MG PO TABS: ORAL_TABLET | ORAL | 0 refills | Status: DC

## 2019-04-01 ENCOUNTER — Ambulatory Visit: Payer: BC Managed Care – PPO | Admitting: Psychiatry

## 2019-04-08 ENCOUNTER — Other Ambulatory Visit: Payer: Self-pay

## 2019-04-08 ENCOUNTER — Ambulatory Visit (INDEPENDENT_AMBULATORY_CARE_PROVIDER_SITE_OTHER): Payer: BC Managed Care – PPO | Admitting: Psychiatry

## 2019-04-08 ENCOUNTER — Encounter: Payer: Self-pay | Admitting: Psychiatry

## 2019-04-08 ENCOUNTER — Other Ambulatory Visit: Payer: Self-pay | Admitting: Physician Assistant

## 2019-04-08 DIAGNOSIS — F341 Dysthymic disorder: Secondary | ICD-10-CM

## 2019-04-08 DIAGNOSIS — F3281 Premenstrual dysphoric disorder: Secondary | ICD-10-CM | POA: Diagnosis not present

## 2019-04-08 MED ORDER — BUPROPION HCL ER (XL) 150 MG PO TB24: 150.0000 mg | ORAL_TABLET | Freq: Every day | ORAL | 0 refills | Status: DC
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
## 2019-04-24 ENCOUNTER — Telehealth: Payer: Self-pay | Admitting: Psychiatry

## 2019-04-24 DIAGNOSIS — F341 Dysthymic disorder: Secondary | ICD-10-CM

## 2019-04-24 DIAGNOSIS — F3281 Premenstrual dysphoric disorder: Secondary | ICD-10-CM

## 2019-04-24 MED ORDER — VENLAFAXINE HCL ER 37.5 MG PO CP24: 37.5000 mg | ORAL_CAPSULE | Freq: Every day | ORAL | 0 refills | Status: DC

## 2019-04-24 NOTE — Telephone Encounter (Signed)
Patient phones that Wellbutrin is increasing her irritable bowel syndrome cramps so that she has discontinued it taking none today and feeling somewhat better though IBS is always a problem.  As her PMDD is still symptomatic, we will reduce the Lexapro from 25 to 20 mg daily without menstrual variation of dose and add Effexor 37.5 mg XR every morning with breakfast #30 with no refill [facility] [address] with education and caution for any serotonin syndrome symptoms.

## 2019-04-24 NOTE — Telephone Encounter (Signed)
Patient left vm today @10 :20 stated she is having after affects from taking Bupropion wants to know if she can take something else.

## 2019-05-16 ENCOUNTER — Other Ambulatory Visit: Payer: Self-pay | Admitting: Psychiatry

## 2019-05-16 DIAGNOSIS — F341 Dysthymic disorder: Secondary | ICD-10-CM

## 2019-05-16 DIAGNOSIS — F3281 Premenstrual dysphoric disorder: Secondary | ICD-10-CM

## 2019-06-24 ENCOUNTER — Encounter: Payer: Self-pay | Admitting: Psychiatry

## 2019-06-24 ENCOUNTER — Ambulatory Visit (INDEPENDENT_AMBULATORY_CARE_PROVIDER_SITE_OTHER): Payer: BC Managed Care – PPO | Admitting: Psychiatry

## 2019-06-24 ENCOUNTER — Other Ambulatory Visit: Payer: Self-pay

## 2019-06-24 DIAGNOSIS — F411 Generalized anxiety disorder: Secondary | ICD-10-CM | POA: Diagnosis not present

## 2019-06-24 DIAGNOSIS — F341 Dysthymic disorder: Secondary | ICD-10-CM

## 2019-06-24 DIAGNOSIS — F3281 Premenstrual dysphoric disorder: Secondary | ICD-10-CM | POA: Diagnosis not present

## 2019-06-24 MED ORDER — VENLAFAXINE HCL ER 75 MG PO CP24: 75.0000 mg | ORAL_CAPSULE | Freq: Every day | ORAL | 0 refills | Status: DC

## 2019-06-24 MED ORDER — ESCITALOPRAM OXALATE 10 MG PO TABS: 10.0000 mg | ORAL_TABLET | Freq: Every day | ORAL | 0 refills | Status: DC
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
## 2019-07-02 ENCOUNTER — Other Ambulatory Visit: Payer: Self-pay | Admitting: Physician Assistant

## 2019-07-07 ENCOUNTER — Other Ambulatory Visit: Payer: Self-pay | Admitting: Psychiatry

## 2019-07-07 DIAGNOSIS — F3281 Premenstrual dysphoric disorder: Secondary | ICD-10-CM

## 2019-07-07 DIAGNOSIS — F341 Dysthymic disorder: Secondary | ICD-10-CM

## 2019-07-08 ENCOUNTER — Telehealth: Payer: Self-pay | Admitting: Psychiatry

## 2019-07-08 DIAGNOSIS — F411 Generalized anxiety disorder: Secondary | ICD-10-CM

## 2019-07-08 DIAGNOSIS — F341 Dysthymic disorder: Secondary | ICD-10-CM

## 2019-07-08 DIAGNOSIS — F3281 Premenstrual dysphoric disorder: Secondary | ICD-10-CM

## 2019-07-08 NOTE — Telephone Encounter (Signed)
Patient phones that since appointment 2 weeks ago, reduction in Lexapro while increasing Effexor has been physically tolerable except her menstrual moods and persistent depressive moods and cramps have been worse which she explains her as ovulatory and menstrual cramps being more prominent with no concern for other GYN disorder.  She has been taking daily journal and calendar notes for her hormonal, emotional, and somatic patterns and concludes that the reduction in Lexapro is more the change causing the problem than increasing the Effexor more likely to help further.  She has no adverse effects from medications currently.  She reviews all warnings and risk for prevention and monitoring and safety hygiene to restore the Lexapro to 20 mg daily while maintaining the Effexor 75 mg XR daily to follow-up for any side effects or other concerns, otherwise she has appointment in 2 months.  She has supply still of 5 mg Lexapro from the past to use up as she doses 20 mg before she would need an additional prescription, having no serotonin syndrome symptoms, again educated for monitoring.

## 2019-07-08 NOTE — Telephone Encounter (Signed)
Pt left v-mail. Meds not working from last visit change. Please return call [phone]

## 2019-07-24 ENCOUNTER — Other Ambulatory Visit: Payer: Self-pay

## 2019-07-24 ENCOUNTER — Encounter: Payer: Self-pay | Admitting: Internal Medicine

## 2019-07-24 ENCOUNTER — Ambulatory Visit: Payer: BC Managed Care – PPO | Admitting: Internal Medicine

## 2019-07-24 VITALS — BP 112/80 | HR 86 | Temp 97.6°F | Ht 64.0 in | Wt 161.4 lb

## 2019-07-24 DIAGNOSIS — K581 Irritable bowel syndrome with constipation: Secondary | ICD-10-CM

## 2019-07-24 DIAGNOSIS — K594 Anal spasm: Secondary | ICD-10-CM

## 2019-07-24 DIAGNOSIS — K645 Perianal venous thrombosis: Secondary | ICD-10-CM | POA: Diagnosis not present

## 2019-07-24 DIAGNOSIS — Z91018 Allergy to other foods: Secondary | ICD-10-CM | POA: Diagnosis not present

## 2019-07-24 MED ORDER — AMBULATORY NON FORMULARY MEDICATION: 2 refills | Status: DC
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
## 2019-07-24 NOTE — Patient Instructions (Addendum)
Please read the information below. Sitz baths are your new best friend right now. 5% Lidocaine cream also known as Recticare may help your anal pain from the hemorrhoid also.   As far as the proctalgia fugax goes I want you to try 0.2% nitroglycerin cream - apply inside the rectum with finger if and when you get another attack (hope not). I am going to ask some colleagues if there is something you can do to make these go away - sometimes physical therapy can help.  I appreciate the opportunity to care for you. Gatha Mayer, MD, [facility]       Hemorrhoids  Hemorrhoids are swollen veins in and around the rectum or anus. There are two types of hemorrhoids:  Internal hemorrhoids. These occur in the veins that are just inside the rectum. They may poke through to the outside and become irritated and painful.  External hemorrhoids. These occur in the veins that are outside the anus and can be felt as a painful swelling or hard lump near the anus. Most hemorrhoids do not cause serious problems, and they can be managed with home treatments such as diet and lifestyle changes. If home treatments do not help the symptoms, procedures can be done to shrink or remove the hemorrhoids. What are the causes? This condition is caused by increased pressure in the anal area. This pressure may result from various things, including:  Constipation.  Straining to have a bowel movement.  Diarrhea.  Pregnancy.  Obesity.  Sitting for long periods of time.  Heavy lifting or other activity that causes you to strain.  Anal sex.  Riding a bike for a long period of time. What are the signs or symptoms? Symptoms of this condition include:  Pain.  Anal itching or irritation.  Rectal bleeding.  Leakage of stool (feces).  Anal swelling.  One or more lumps around the anus. How is this diagnosed? This condition can often be diagnosed through a visual exam. Other exams or tests may also be done, such  as:  An exam that involves feeling the rectal area with a gloved hand (digital rectal exam).  An exam of the anal canal that is done using a small tube (anoscope).  A blood test, if you have lost a significant amount of blood.  A test to look inside the colon using a flexible tube with a camera on the end (sigmoidoscopy or colonoscopy). How is this treated? This condition can usually be treated at home. However, various procedures may be done if dietary changes, lifestyle changes, and other home treatments do not help your symptoms. These procedures can help make the hemorrhoids smaller or remove them completely. Some of these procedures involve surgery, and others do not. Common procedures include:  Rubber band ligation. Rubber bands are placed at the base of the hemorrhoids to cut off their blood supply.  Sclerotherapy. Medicine is injected into the hemorrhoids to shrink them.  Infrared coagulation. A type of light energy is used to get rid of the hemorrhoids.  Hemorrhoidectomy surgery. The hemorrhoids are surgically removed, and the veins that supply them are tied off.  Stapled hemorrhoidopexy surgery. The surgeon staples the base of the hemorrhoid to the rectal wall. Follow these instructions at home: Eating and drinking    Eat foods that have a lot of fiber in them, such as whole grains, beans, nuts, fruits, and vegetables.  Ask your health care provider about taking products that have added fiber (fiber supplements).  Reduce the  amount of fat in your diet. You can do this by eating low-fat dairy products, eating less red meat, and avoiding processed foods.  Drink enough fluid to keep your urine pale yellow. Managing pain and swelling    Take warm sitz baths for 20 minutes, 3-4 times a day to ease pain and discomfort. You may do this in a bathtub or using a portable sitz bath that fits over the toilet.  If directed, apply ice to the affected area. Using ice packs between  sitz baths may be helpful. ? Put ice in a plastic bag. ? Place a towel between your skin and the bag. ? Leave the ice on for 20 minutes, 2-3 times a day. General instructions  Take over-the-counter and prescription medicines only as told by your health care provider.  Use medicated creams or suppositories as told.  Get regular exercise. Ask your health care provider how much and what kind of exercise is best for you. In general, you should do moderate exercise for at least 30 minutes on most days of the week (150 minutes each week). This can include activities such as walking, biking, or yoga.  Go to the bathroom when you have the urge to have a bowel movement. Do not wait.  Avoid straining to have bowel movements.  Keep the anal area dry and clean. Use wet toilet paper or moist towelettes after a bowel movement.  Do not sit on the toilet for long periods of time. This increases blood pooling and pain.  Keep all follow-up visits as told by your health care provider. This is important. Contact a health care provider if you have:  Increasing pain and swelling that are not controlled by treatment or medicine.  Difficulty having a bowel movement, or you are unable to have a bowel movement.  Pain or inflammation outside the area of the hemorrhoids. Get help right away if you have:  Uncontrolled bleeding from your rectum. Summary  Hemorrhoids are swollen veins in and around the rectum or anus.  Most hemorrhoids can be managed with home treatments such as diet and lifestyle changes.  Taking warm sitz baths can help ease pain and discomfort.  In severe cases, procedures or surgery can be done to shrink or remove the hemorrhoids. This information is not intended to replace advice given to you by your health care provider. Make sure you discuss any questions you have with your health care provider. Document Released: 11/03/2000 Document Revised: 11/14/2018 Document Reviewed: 03/28/2018  Elsevier Patient Education  2020 [employer].  How to Take a [employer] A sitz bath is a warm water bath that may be used to care for your rectum, genital area, or the area between your rectum and genitals (perineum). For a sitz bath, the water only comes up to your hips and covers your buttocks. A sitz bath may done at home in a bathtub or with a portable sitz bath that fits over the toilet. Your health care provider may recommend a sitz bath to help:  Relieve pain and discomfort after delivering a baby.  Relieve pain and itching from hemorrhoids or anal fissures.  Relieve pain after certain surgeries.  Relax muscles that are sore or tight. How to take a sitz bath Take 3-4 sitz baths a day, or as many as told by your health care provider. Bathtub sitz bath  To take a sitz bath in a bathtub: 1. Partially fill a bathtub with warm water. The water should be deep enough to  cover your hips and buttocks when you are sitting in the tub. 2. If your health care provider told you to put medicine in the water, follow his or her instructions. 3. Sit in the water. 4. Open the tub drain a little, and leave it open during your bath. 5. Turn on the warm water again, enough to replace the water that is draining out. Keep the water running throughout your bath. This helps keep the water at the right level and the right temperature. 6. Soak in the water for 15-20 minutes, or as long as told by your health care provider. 7. When you are done, be careful when you stand up. You may feel dizzy. 8. After the sitz bath, pat yourself dry. Do not rub your skin to dry it.  Over-the-toilet sitz bath To take a sitz bath with an over-the-toilet basin: 1. Follow the manufacturer's instructions. 2. Fill the basin with warm water. 3. If your health care provider told you to put medicine in the water, follow his or her instructions. 4. Sit on the seat. Make sure the water covers your buttocks and perineum. 5.  Soak in the water for 15-20 minutes, or as long as told by your health care provider. 6. After the sitz bath, pat yourself dry. Do not rub your skin to dry it. 7. Clean and dry the basin between uses. 8. Discard the basin if it cracks, or according to the manufacturer's instructions. Contact a health care provider if:  Your symptoms get worse. Do not continue with sitz baths if your symptoms get worse.  You have new symptoms. If this happens, do not continue with sitz baths until you talk with your health care provider. Summary  A sitz bath is a warm water bath in which the water only comes up to your hips and covers your buttocks.  A sitz bath may help relieve itching, relieve pain, and relax muscles that are sore or tight in the lower part of your body, including your genital area.  Take 3-4 sitz baths a day, or as many as told by your health care provider. Soak in the water for 15-20 minutes.  Do not continue with sitz baths if your symptoms get worse. This information is not intended to replace advice given to you by your health care provider. Make sure you discuss any questions you have with your health care provider. Document Released: 07/29/2004 Document Revised: 11/08/2017 Document Reviewed: 11/08/2017 Elsevier Patient Education  [place].  I appreciate the opportunity to care for you. Silvano Rusk, MD, [facility]

## 2019-07-25 DIAGNOSIS — K645 Perianal venous thrombosis: Secondary | ICD-10-CM | POA: Insufficient documentation

## 2019-07-25 DIAGNOSIS — Z91018 Allergy to other foods: Secondary | ICD-10-CM | POA: Insufficient documentation

## 2019-07-25 DIAGNOSIS — K594 Anal spasm: Secondary | ICD-10-CM | POA: Insufficient documentation

## 2019-07-25 DIAGNOSIS — K581 Irritable bowel syndrome with constipation: Secondary | ICD-10-CM | POA: Insufficient documentation

## 2019-07-25 NOTE — Assessment & Plan Note (Signed)
Saw the allergist in 2018 and had positive skin prick test to almonds, [place] nuts pecans.  Told to avoid all tree nuts.  She had an IgE serology positive to wheat.  Also dairy positive skin test.

## 2019-07-25 NOTE — Assessment & Plan Note (Signed)
Continue MiraLAX this seems to be working well.

## 2019-07-25 NOTE — Assessment & Plan Note (Signed)
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

## 2019-07-25 NOTE — Assessment & Plan Note (Signed)
She will do sitz bath and conservative care.  I think she is beyond the window to have this excised with a thrombectomy.  She is comfortable with this plan.  Follow-up later to consider ligation of internal hemorrhoids.

## 2019-07-29 ENCOUNTER — Telehealth: Payer: Self-pay

## 2019-07-29 DIAGNOSIS — N942 Vaginismus: Secondary | ICD-10-CM

## 2019-07-29 DIAGNOSIS — K594 Anal spasm: Secondary | ICD-10-CM

## 2019-07-29 NOTE — Telephone Encounter (Signed)
I spoke with Louretta Parma and told her what Dr Carlean Purl told me to tell her. I have put in the referral and they will reach out to her to set up her therapy.

## 2019-07-29 NOTE — Telephone Encounter (Signed)
-----   Message from Gatha Mayer, MD sent at 07/29/2019 12:53 PM EDT ----- Regarding: RE: Question about proctalgia fugax That's great to know.  I will have my CMA refer her for treatment of proctalgia fugax and vaginismus  [facility]  ----- Message ----- From: Monico Hoar, PT Sent: 07/25/2019   8:49 PM EDT To: Gatha Mayer, MD Subject: RE: Question about proctalgia fugax            Both of [place] work with this all the time. We would love to see her. Have a great weekend. 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  Is this something you are Janey Genta could help her with?  Thanks  Glendell Docker

## 2019-08-12 ENCOUNTER — Ambulatory Visit: Payer: BC Managed Care – PPO | Attending: Internal Medicine | Admitting: Physical Therapy

## 2019-08-12 ENCOUNTER — Encounter: Payer: Self-pay | Admitting: Physical Therapy

## 2019-08-12 ENCOUNTER — Other Ambulatory Visit: Payer: Self-pay

## 2019-08-12 DIAGNOSIS — M6281 Muscle weakness (generalized): Secondary | ICD-10-CM

## 2019-08-12 DIAGNOSIS — R252 Cramp and spasm: Secondary | ICD-10-CM

## 2019-08-12 DIAGNOSIS — R279 Unspecified lack of coordination: Secondary | ICD-10-CM | POA: Diagnosis present

## 2019-08-12 NOTE — Patient Instructions (Addendum)
Relaxation Exercises with the Urge to Void   When you experience an urge to void:  FIRST  Stop and stand very still    Sit down if you can    Don't move    You need to stay very still to maintain control  SECOND Squeeze your pelvic floor muscles 5 times, like a quick flick, to keep from leaking  THIRD Relax  Take a deep breath and then let it out  Try to make the urge go away by using relaxation and visualization techniques  FINALLY When you feel the urge go away somewhat, walk normally to the bathroom.   If the urge gets suddenly stronger on the way, you may stop again and relax to regain control.  Toileting Techniques for Bowel Movements (Defecation) Using your belly (abdomen) and pelvic floor muscles to have a bowel movement is usually instinctive.  Sometimes people can have problems with these muscles and have to relearn proper defecation (emptying) techniques.  If you have weakness in your muscles, organs that are falling out, decreased sensation in your pelvis, or ignore your urge to go, you may find yourself straining to have a bowel movement.  You are straining if you are: . holding your breath or taking in a huge gulp of air and holding it  . keeping your lips and jaw tensed and closed tightly . turning red in the face because of excessive pushing or forcing . developing or worsening your  hemorrhoids . getting faint while pushing . not emptying completely and have to defecate many times a day  If you are straining, you are actually making it harder for yourself to have a bowel movement.  Many people find they are pulling up with the pelvic floor muscles and closing off instead of opening the anus. Due to lack pelvic floor relaxation and coordination the abdominal muscles, one has to work harder to push the feces out.  Many people have never been taught how to defecate efficiently and effectively.  Notice what happens to your body when you are having a bowel movement.  While  you are sitting on the toilet pay attention to the following areas: . Jaw and mouth position . Angle of your hips   . Whether your feet touch the ground or not . Arm placement  . Spine position . Waist . Belly tension . Anus (opening of the anal canal)  An Evacuation/Defecation Plan   Here are the 4 basic points:  1. Lean forward enough for your elbows to rest on your knees 2. Support your feet on the floor or use a low stool if your feet don't touch the floor  3. Push out your belly as if you have swallowed a beach ball-you should feel a widening of your waist 4. Open and relax your pelvic floor muscles, rather than tightening around the anus      The following conditions my require modifications to your toileting posture:  . If you have had surgery in the past that limits your back, hip, pelvic, knee or ankle flexibility . Constipation   Your healthcare practitioner may make the following additional suggestions and adjustments:  1) Sit on the toilet  a) Make sure your feet are supported. b) Notice your hip angle and spine position-most people find it effective to lean forward or raise their knees, which can help the muscles around the anus to relax  c) When you lean forward, place your forearms on your thighs for support  2) Relax suggestions a) Breath deeply in through your nose and out slowly through your mouth as if you are smelling the flowers and blowing out the candles. b) To become aware of how to relax your muscles, contracting and releasing muscles can be helpful.  Pull your pelvic floor muscles in tightly by using the image of holding back gas, or closing around the anus (visualize making a circle smaller) and lifting the anus up and in.  Then release the muscles and your anus should drop down and feel open. Repeat 5 times ending with the feeling of relaxation. c) Keep your pelvic floor muscles relaxed; let your belly bulge out. d) The digestive tract starts at the  mouth and ends at the anal opening, so be sure to relax both ends of the tube.  Place your tongue on the roof of your mouth with your teeth separated.  This helps relax your mouth and will help to relax the anus at the same time.  3) Empty (defecation) a) Keep your pelvic floor and sphincter relaxed, then bulge your anal muscles.  Make the anal opening wide.  b) Stick your belly out as if you have swallowed a beach ball. c) Make your belly wall hard using your belly muscles while continuing to breathe. Doing this makes it easier to open your anus. d) Breath out and give a grunt (or try using other sounds such as ahhhh, shhhhh, ohhhh or grrrrrrr).  4) Finish a) As you finish your bowel movement, pull the pelvic floor muscles up and in.  This will leave your anus in the proper place rather than remaining pushed out and down. If you leave your anus pushed out and down, it will start to feel as though that is normal and give you incorrect signals about needing to have a bowel movement.    [facility] [address] [place], [place] [address]
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
## 2019-08-14 ENCOUNTER — Other Ambulatory Visit: Payer: Self-pay | Admitting: Psychiatry

## 2019-08-14 DIAGNOSIS — F341 Dysthymic disorder: Secondary | ICD-10-CM

## 2019-08-14 DIAGNOSIS — F3281 Premenstrual dysphoric disorder: Secondary | ICD-10-CM

## 2019-08-20 ENCOUNTER — Encounter: Payer: Self-pay | Admitting: Physical Therapy

## 2019-08-20 ENCOUNTER — Ambulatory Visit: Payer: BC Managed Care – PPO | Admitting: Physical Therapy

## 2019-08-20 ENCOUNTER — Other Ambulatory Visit: Payer: Self-pay

## 2019-08-20 DIAGNOSIS — R279 Unspecified lack of coordination: Secondary | ICD-10-CM

## 2019-08-20 DIAGNOSIS — M6281 Muscle weakness (generalized): Secondary | ICD-10-CM

## 2019-08-20 DIAGNOSIS — R252 Cramp and spasm: Secondary | ICD-10-CM
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
## 2019-08-21 ENCOUNTER — Encounter

## 2019-08-26 ENCOUNTER — Other Ambulatory Visit: Payer: Self-pay

## 2019-08-26 ENCOUNTER — Encounter: Payer: Self-pay | Admitting: Physical Therapy

## 2019-08-26 ENCOUNTER — Ambulatory Visit: Payer: BC Managed Care – PPO | Attending: Internal Medicine | Admitting: Physical Therapy

## 2019-08-26 DIAGNOSIS — R279 Unspecified lack of coordination: Secondary | ICD-10-CM

## 2019-08-26 DIAGNOSIS — R252 Cramp and spasm: Secondary | ICD-10-CM | POA: Diagnosis present

## 2019-08-26 DIAGNOSIS — M6281 Muscle weakness (generalized): Secondary | ICD-10-CM | POA: Diagnosis present
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
## 2019-09-02 ENCOUNTER — Other Ambulatory Visit: Payer: Self-pay

## 2019-09-02 ENCOUNTER — Ambulatory Visit: Payer: BC Managed Care – PPO | Admitting: Physical Therapy

## 2019-09-02 ENCOUNTER — Encounter: Payer: Self-pay | Admitting: Physical Therapy

## 2019-09-02 DIAGNOSIS — R252 Cramp and spasm: Secondary | ICD-10-CM

## 2019-09-02 DIAGNOSIS — R279 Unspecified lack of coordination: Secondary | ICD-10-CM | POA: Diagnosis not present

## 2019-09-02 DIAGNOSIS — M6281 Muscle weakness (generalized): Secondary | ICD-10-CM
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
## 2019-09-02 NOTE — Patient Instructions (Signed)
Access Code: [email or web address]  URL: [email or web address]  Date: 09/02/2019  Prepared by: Jari Favre   Exercises  Supine Diaphragmatic Breathing - 10 reps - 1 sets - 3x daily - 7x weekly  Supine Lower Trunk Rotation - 10 reps - 1 sets - 5 sec hold - 1x daily - 7x weekly  Doorway Piriformis Stretch - 3 reps - 1 sets - 30 sec hold - 1x daily - 7x weekly  Supine Hip Internal and External Rotation - 10 reps - 1 sets - 10 sec hold - 1x daily - 7x weekly  Static Prone on Elbows - 3 reps - 1 sets - 30 sec hold - 1x daily - 7x weekly  Cat-Camel - 10 reps - 1 sets - 3 sec hold - 1x daily - 7x weekly  Child's Pose with Thread the Needle - 10 reps - 1 sets - 10 sec hold - 1x daily - 7x weekly  Sidelying Thoracic Rotation with Open Book - 5 reps - 1 sets - 10 sec hold - 1x daily - 7x weekly  Sidelying Pelvic Floor Contraction with Self-Palpation - 10 reps - 3 sets - 1x daily - 7x weekly

## 2019-09-09 ENCOUNTER — Other Ambulatory Visit (INDEPENDENT_AMBULATORY_CARE_PROVIDER_SITE_OTHER): Payer: BC Managed Care – PPO

## 2019-09-09 ENCOUNTER — Encounter: Payer: Self-pay | Admitting: Internal Medicine

## 2019-09-09 ENCOUNTER — Ambulatory Visit: Payer: BC Managed Care – PPO | Admitting: Internal Medicine

## 2019-09-09 VITALS — BP 110/78 | HR 72 | Temp 97.8°F | Ht 64.0 in | Wt 161.0 lb

## 2019-09-09 DIAGNOSIS — N942 Vaginismus: Secondary | ICD-10-CM | POA: Insufficient documentation

## 2019-09-09 DIAGNOSIS — N946 Dysmenorrhea, unspecified: Secondary | ICD-10-CM | POA: Insufficient documentation

## 2019-09-09 DIAGNOSIS — K645 Perianal venous thrombosis: Secondary | ICD-10-CM

## 2019-09-09 DIAGNOSIS — K581 Irritable bowel syndrome with constipation: Secondary | ICD-10-CM

## 2019-09-09 DIAGNOSIS — K648 Other hemorrhoids: Secondary | ICD-10-CM | POA: Diagnosis not present

## 2019-09-09 DIAGNOSIS — T50905A Adverse effect of unspecified drugs, medicaments and biological substances, initial encounter: Secondary | ICD-10-CM | POA: Insufficient documentation

## 2019-09-09 DIAGNOSIS — K594 Anal spasm: Secondary | ICD-10-CM

## 2019-09-09 LAB — IGA: IgA: 294 mg/dL (ref 68–378)

## 2019-09-09 LAB — TSH: TSH: 1.79 u[IU]/mL (ref 0.35–4.50)

## 2019-09-09 MED ORDER — LUBIPROSTONE 8 MCG PO CAPS: 8.0000 ug | ORAL_CAPSULE | Freq: Two times a day (BID) | ORAL | 0 refills | Status: DC

## 2019-09-09 NOTE — Assessment & Plan Note (Signed)
Discontinue dicyclomine

## 2019-09-09 NOTE — Assessment & Plan Note (Signed)
Not complaining of this today though has been to pelvic PT.  Work on home exercise program since she cannot afford pelvic PT at this time.

## 2019-09-09 NOTE — Assessment & Plan Note (Signed)
She is dissatisfied with her current gynecologic care.  She tends to get constipated significantly around the time of her menses.  We discussed that she thought it would be a good idea to get another opinion and switch GYN.  Referred to Dr. Edwinna Areola.

## 2019-09-09 NOTE — Patient Instructions (Signed)
Your provider has requested that you go to the basement level for lab work before leaving today. Press "B" on the elevator. The lab is located at the first door on the left as you exit the elevator.   Stop the dicyclomine since it gave you blurred vision.  Today we are giving you samples of FDgard to try and also Amitiza: Take one capsule twice daily with food. Stop the miralax when you start the samples of the Amitiza.   We are going to refer you to Dr Edwinna Areola.   I appreciate the opportunity to care for you. Silvano Rusk, MD, [facility]

## 2019-09-09 NOTE — Assessment & Plan Note (Addendum)
Trial of Amitiza 8 mcg twice daily consider other doses depending upon clinical course, other regions as well.  Check TSH, was normal 2018 with a recheck.  Check celiac serologies.

## 2019-09-09 NOTE — Assessment & Plan Note (Signed)
This is resolved

## 2019-09-09 NOTE — Assessment & Plan Note (Signed)
Unfortunately pelvic floor PT was cost prohibitive, fortunately it seemed to help some and she is trying to do exercises at home.
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
## 2019-09-10 ENCOUNTER — Encounter: Payer: BC Managed Care – PPO | Admitting: Physical Therapy

## 2019-09-10 LAB — TISSUE TRANSGLUTAMINASE, IGA: (tTG) Ab, IgA: 1 U/mL

## 2019-09-11 ENCOUNTER — Telehealth: Payer: Self-pay

## 2019-09-11 NOTE — Telephone Encounter (Signed)
I called and told her that Dr Carron Brazen office will be calling her to set up an appointment. She said to let Dr Carlean Purl know that she is still not eating much. She is asking for her lab results.

## 2019-09-12 NOTE — Telephone Encounter (Signed)
Patient informed and we booked her an appointment. I told her to keep a journal to show Dr Carlean Purl at her visit since she gets such bad reactions from foods, etc. I informed her of her lab results and she is aware to call us back if she is not better in a week.

## 2019-09-12 NOTE — Telephone Encounter (Addendum)
Labs show normal thyroid fx  Also no celiac disease  She should try the [place] (we sampled I believe) and give it a bit more time  Have her set up a 4-6 week appt also   If she is no better in 1 week she should let us know

## 2019-09-16 ENCOUNTER — Telehealth: Payer: Self-pay | Admitting: Internal Medicine

## 2019-09-16 ENCOUNTER — Encounter: Payer: BC Managed Care – PPO | Admitting: Physical Therapy

## 2019-09-16 ENCOUNTER — Encounter: Payer: Self-pay | Admitting: Obstetrics & Gynecology

## 2019-09-16 MED ORDER — LUBIPROSTONE 8 MCG PO CAPS: 8.0000 ug | ORAL_CAPSULE | Freq: Two times a day (BID) | ORAL | 3 refills | Status: DC

## 2019-09-16 NOTE — Telephone Encounter (Signed)
Sheri did this

## 2019-09-16 NOTE — Telephone Encounter (Signed)
Pt reported that Amitiza has been working and requested a refill.

## 2019-09-16 NOTE — Telephone Encounter (Signed)
Sir please let me know how many you approve of, thanks.

## 2019-09-16 NOTE — Telephone Encounter (Signed)
Pt reported that her symptoms persist: unable to eat, abd discomfort, lack of energy.  Please call back to discuss.

## 2019-09-16 NOTE — Telephone Encounter (Signed)
Patient reports that she is not seeing an improvement with the [facility].  She feels worse after she eats, discomfort, bloating, more weight loss. She has no energy and she is having a great difficulty eating.  She has epigastric tenderness at night, can only sleep on her left side due to the discomfort.  Sara Bishop is helping .  I sent in a new rx.

## 2019-09-16 NOTE — Telephone Encounter (Signed)
Given persistent post-prandial pain I recommend she have an EGD

## 2019-09-17 NOTE — Telephone Encounter (Signed)
Patient notified  EGD, pre-visit and COVID screen scheduled

## 2019-09-23 ENCOUNTER — Telehealth: Payer: Self-pay

## 2019-09-23 ENCOUNTER — Encounter: Payer: BC Managed Care – PPO | Admitting: Physical Therapy

## 2019-09-23 ENCOUNTER — Telehealth: Payer: Self-pay | Admitting: Internal Medicine

## 2019-09-23 ENCOUNTER — Encounter: Payer: Self-pay | Admitting: Internal Medicine

## 2019-09-23 ENCOUNTER — Other Ambulatory Visit: Payer: Self-pay

## 2019-09-23 ENCOUNTER — Ambulatory Visit (AMBULATORY_SURGERY_CENTER): Payer: Self-pay

## 2019-09-23 VITALS — Temp 97.9°F | Ht 64.0 in | Wt 155.4 lb

## 2019-09-23 DIAGNOSIS — R1013 Epigastric pain: Secondary | ICD-10-CM

## 2019-09-23 MED ORDER — LUBIPROSTONE 8 MCG PO CAPS: 8.0000 ug | ORAL_CAPSULE | Freq: Two times a day (BID) | ORAL | 3 refills | Status: DC

## 2019-09-23 NOTE — Telephone Encounter (Signed)
Patient came by the office and rx has been resent and I gave her a box of samples.

## 2019-09-23 NOTE — Telephone Encounter (Signed)
Patient stopped in to say that the Amitiza wasn't at the drug store. I have resent it and gave her one sample pak.

## 2019-09-23 NOTE — Progress Notes (Signed)
Denies allergies to eggs or soy products. Denies complication of anesthesia or sedation. Denies use of weight loss medication. Denies use of O2.   Emmi instructions given for colonoscopy.  Covid screening is scheduled for 09/25/19 @ 9:20 Am.

## 2019-09-25 ENCOUNTER — Other Ambulatory Visit: Payer: Self-pay

## 2019-09-25 LAB — SARS CORONAVIRUS 2 (TAT 6-24 HRS): SARS Coronavirus 2: NEGATIVE

## 2019-09-29 ENCOUNTER — Encounter: Payer: Self-pay | Admitting: Obstetrics & Gynecology

## 2019-09-29 ENCOUNTER — Encounter: Payer: Self-pay | Admitting: Internal Medicine

## 2019-09-29 ENCOUNTER — Ambulatory Visit (AMBULATORY_SURGERY_CENTER): Payer: BC Managed Care – PPO | Admitting: Internal Medicine

## 2019-09-29 ENCOUNTER — Other Ambulatory Visit: Payer: Self-pay

## 2019-09-29 ENCOUNTER — Ambulatory Visit: Payer: BC Managed Care – PPO | Admitting: Obstetrics & Gynecology

## 2019-09-29 VITALS — BP 140/92 | HR 80 | Temp 98.0°F | Resp 12 | Ht 64.0 in | Wt 155.0 lb

## 2019-09-29 VITALS — BP 115/80 | HR 69 | Temp 98.2°F | Resp 16 | Ht 64.0 in | Wt 155.0 lb

## 2019-09-29 DIAGNOSIS — K2950 Unspecified chronic gastritis without bleeding: Secondary | ICD-10-CM

## 2019-09-29 DIAGNOSIS — K3189 Other diseases of stomach and duodenum: Secondary | ICD-10-CM | POA: Diagnosis not present

## 2019-09-29 DIAGNOSIS — K2970 Gastritis, unspecified, without bleeding: Secondary | ICD-10-CM

## 2019-09-29 DIAGNOSIS — F341 Dysthymic disorder: Secondary | ICD-10-CM | POA: Diagnosis not present

## 2019-09-29 DIAGNOSIS — R102 Pelvic and perineal pain: Secondary | ICD-10-CM | POA: Diagnosis not present

## 2019-09-29 DIAGNOSIS — K581 Irritable bowel syndrome with constipation: Secondary | ICD-10-CM | POA: Diagnosis not present

## 2019-09-29 DIAGNOSIS — N946 Dysmenorrhea, unspecified: Secondary | ICD-10-CM

## 2019-09-29 DIAGNOSIS — R1013 Epigastric pain: Secondary | ICD-10-CM

## 2019-09-29 MED ORDER — SODIUM CHLORIDE 0.9 % IV SOLN: 500.0000 mL | Freq: Once | INTRAVENOUS | Status: DC

## 2019-09-29 NOTE — Patient Instructions (Addendum)
The stomach was mildly irritated - call that gastritis.  I took biopsies to evaluate further- including the other normal stomach and in the duodenum.  I will call results and recommendations.  I appreciate the opportunity to care for you. Gatha Mayer, MD, FACG  YOU HAD AN ENDOSCOPIC PROCEDURE TODAY AT [facility]:   Refer to the procedure report that was given to you for any specific questions about what was found during the examination.  If the procedure report does not answer your questions, please call your gastroenterologist to clarify.  If you requested that your care partner not be given the details of your procedure findings, then the procedure report has been included in a sealed envelope for you to review at your convenience later.  ** Handout given on Gastritis**  YOU SHOULD EXPECT: Some feelings of bloating in the abdomen. Passage of more gas than usual.  Walking can help get rid of the air that was put into your GI tract during the procedure and reduce the bloating. If you had a lower endoscopy (such as a colonoscopy or flexible sigmoidoscopy) you may notice spotting of blood in your stool or on the toilet paper. If you underwent a bowel prep for your procedure, you may not have a normal bowel movement for a few days.  Please Note:  You might notice some irritation and congestion in your nose or some drainage.  This is from the oxygen used during your procedure.  There is no need for concern and it should clear up in a day or so.  SYMPTOMS TO REPORT IMMEDIATELY:   Following upper endoscopy (EGD)  Vomiting of blood or coffee ground material  New chest pain or pain under the shoulder blades  Painful or persistently difficult swallowing  New shortness of breath  Fever of 100F or higher  Black, tarry-looking stools  For urgent or emergent issues, a gastroenterologist can be reached at any hour by calling [phone].   DIET:  We do recommend a small  meal at first, but then you may proceed to your regular diet.  Drink plenty of fluids but you should avoid alcoholic beverages for 24 hours.  ACTIVITY:  You should plan to take it easy for the rest of today and you should NOT DRIVE or use heavy machinery until tomorrow (because of the sedation medicines used during the test).    FOLLOW UP: Our staff will call the number listed on your records 48-72 hours following your procedure to check on you and address any questions or concerns that you may have regarding the information given to you following your procedure. If we do not reach you, we will leave a message.  We will attempt to reach you two times.  During this call, we will ask if you have developed any symptoms of COVID 19. If you develop any symptoms (ie: fever, flu-like symptoms, shortness of breath, cough etc.) before then, please call [phone].  If you test positive for Covid 19 in the 2 weeks post procedure, please call and report this information to [place].    If any biopsies were taken you will be contacted by phone or by letter within the next 1-3 weeks.  Please call us at [phone] if you have not heard about the biopsies in 3 weeks.    SIGNATURES/CONFIDENTIALITY: You and/or your care partner have signed paperwork which will be entered into your electronic medical record.  These signatures attest to the fact that that  the information above on your After Visit Summary has been reviewed and is understood.  Full responsibility of the confidentiality of this discharge information lies with you and/or your care-partner.

## 2019-09-29 NOTE — Progress Notes (Signed)
Report to PACU, RN, vss, BBS= Clear.  

## 2019-09-29 NOTE — Progress Notes (Signed)
Temp check by:LC Vital check by:Torboy  The patient states no changes in medical or surgical history since pre-visit screening on 09/23/2019.

## 2019-09-29 NOTE — Op Note (Signed)
[facility] Patient Name: Sara Bishop Procedure Date: 09/29/2019 10:01 AM MRN: [id number] Endoscopist: Gatha Mayer , MD Age: 43 Referring MD:  Date of Birth: 30-Jun-1976 Gender: Female Account #: [id number] Procedure:                Upper GI endoscopy Indications:              Abdominal pain in the left upper quadrant, Dyspepsia Medicines:                Propofol per Anesthesia, Monitored Anesthesia Care Procedure:                Pre-Anesthesia Assessment:                           - Prior to the procedure, a History and Physical                            was performed, and patient medications and                            allergies were reviewed. The patient's tolerance of                            previous anesthesia was also reviewed. The risks                            and benefits of the procedure and the sedation                            options and risks were discussed with the patient.                            All questions were answered, and informed consent                            was obtained. Prior Anticoagulants: The patient has                            taken no previous anticoagulant or antiplatelet                            agents. ASA Grade Assessment: II - A patient with                            mild systemic disease. After reviewing the risks                            and benefits, the patient was deemed in                            satisfactory condition to undergo the procedure.                           After obtaining informed consent, the endoscope was  passed under direct vision. Throughout the                            procedure, the patient's blood pressure, pulse, and                            oxygen saturations were monitored continuously. The                            Endoscope was introduced through the mouth, and                            advanced to the second part of duodenum. The upper                           GI endoscopy was accomplished without difficulty.                            The patient tolerated the procedure well. Scope In: Scope Out: Findings:                 Diffuse mildly erythematous mucosa without bleeding                            was found in the prepyloric region of the stomach.                            Biopsies were taken with a cold forceps for                            histology. Verification of patient identification                            for the specimen was done. Estimated blood loss was                            minimal.                           The exam was otherwise without abnormality.                           The cardia and gastric fundus were normal on                            retroflexion. [address]                           Biopsies were taken with a cold forceps in the                            gastric body and in the entire duodenum for                            histology. Verification of patient identification  for the specimen was done. Estimated blood loss was                            minimal. Complications:            No immediate complications. Estimated Blood Loss:     Estimated blood loss was minimal. Impression:               - Erythematous mucosa in the prepyloric region of                            the stomach. Biopsied.                           - The examination was otherwise normal.                           - Biopsies were taken with a cold forceps for                            histology in the gastric body and in the entire                            duodenum. Recommendation:           - Patient has a contact number available for                            emergencies. The signs and symptoms of potential                            delayed complications were discussed with the                            patient. Return to normal activities tomorrow.                             Written discharge instructions were provided to the                            patient.                           - Resume previous diet.                           - Continue present medications.                           - Await pathology results. Gatha Mayer, MD 09/29/2019 10:16:39 AM This report has been signed electronically.

## 2019-09-29 NOTE — Progress Notes (Signed)
Called to room to assist during endoscopic procedure.  Patient ID and intended procedure confirmed with present staff. Received instructions for my participation in the procedure from the performing physician.  
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
## 2019-09-30 ENCOUNTER — Encounter: Payer: Self-pay | Admitting: Obstetrics & Gynecology

## 2019-10-01 ENCOUNTER — Telehealth: Payer: Self-pay | Admitting: *Deleted

## 2019-10-01 ENCOUNTER — Other Ambulatory Visit: Payer: Self-pay | Admitting: Psychiatry

## 2019-10-01 DIAGNOSIS — F3281 Premenstrual dysphoric disorder: Secondary | ICD-10-CM

## 2019-10-01 DIAGNOSIS — F411 Generalized anxiety disorder: Secondary | ICD-10-CM

## 2019-10-01 DIAGNOSIS — F341 Dysthymic disorder: Secondary | ICD-10-CM

## 2019-10-01 NOTE — Telephone Encounter (Signed)
  Follow up Call-  Call back number 09/29/2019  Post procedure Call Back phone  # [phone]  Permission to leave phone message Yes  Some recent data might be hidden     Patient questions:  Do you have a fever, pain , or abdominal swelling? No. Pain Score  0 *  Have you tolerated food without any problems? Yes.    Have you been able to return to your normal activities? Yes.    Do you have any questions about your discharge instructions: Diet   No. Medications  No. Follow up visit  No.  Do you have questions or concerns about your Care? No.  Actions: * If pain score is 4 or above: No action needed, pain <4.  1. Have you developed a fever since your procedure? no  2.   Have you had an respiratory symptoms (SOB or cough) since your procedure? no  3.   Have you tested positive for COVID 19 since your procedure no  4.   Have you had any family members/close contacts diagnosed with the COVID 19 since your procedure?  no   If yes to any of these questions please route to Joylene John, RN and Alphonsa Gin, Therapist, sports.

## 2019-10-01 NOTE — Telephone Encounter (Signed)
  Follow up Call-  Call back number 09/29/2019  Post procedure Call Back phone  # [phone]  Permission to leave phone message Yes  Some recent data might be hidden     Patient questions:  Message left to call us if necessary.

## 2019-10-01 NOTE — Telephone Encounter (Signed)
Last visit 08/04

## 2019-10-01 NOTE — Telephone Encounter (Signed)
At last appointment 07/01/2019, PMDD management along with anxiety reduced Lexapro 50% to 10 mg while doubling Effexor to 75 mg XR daily.  Patient phoned again in 1 week on 07/08/2019 to restore Lexapro to 20 mg though doing okay with the Effexor 75 mg XR relative to symptom management and medication efficacy/side effects.  As pharmacy requests a refill of the Lexapro, dosing is advanced to that 20 mg daily dose as of interim phone adjustment though if patient did at some point resume the 10 mg, she may find this in half.  She has had GYN and GI procedures in the interim likely delaying her appointment here.  90-day supply of Lexapro 20 and Effexor 75 mg XR is [facility] to [facility].

## 2019-10-02 ENCOUNTER — Encounter: Payer: Self-pay | Admitting: Obstetrics & Gynecology

## 2019-10-02 ENCOUNTER — Other Ambulatory Visit: Payer: BC Managed Care – PPO

## 2019-10-02 ENCOUNTER — Other Ambulatory Visit: Payer: BC Managed Care – PPO | Admitting: Obstetrics & Gynecology

## 2019-10-02 ENCOUNTER — Telehealth: Payer: Self-pay | Admitting: Obstetrics & Gynecology

## 2019-10-02 NOTE — Telephone Encounter (Signed)
Patient dnka her PUS appoint men today. I left mesas for her to call and reschedule.

## 2019-10-02 NOTE — Telephone Encounter (Signed)
Patient returning call regarding [facility] appointment. Informed patient she would be getting a call from [place] or [place] to get the appointment rescheduled.

## 2019-10-03 NOTE — Telephone Encounter (Signed)
Spoke with patient. PUS rescheduled to 11/19 at 3pm, consult at 3:30pm with Dr. Sabra Heck.   Routing to provider for final review. Patient is agreeable to disposition. Will close encounter.  Cc: Lerry Liner, Magdalene Patricia

## 2019-10-07 ENCOUNTER — Other Ambulatory Visit: Payer: Self-pay

## 2019-10-08 NOTE — Progress Notes (Signed)
Results discussed with patient by phone'no letter/recall

## 2019-10-09 ENCOUNTER — Encounter: Payer: Self-pay | Admitting: Obstetrics & Gynecology

## 2019-10-09 ENCOUNTER — Other Ambulatory Visit: Payer: BC Managed Care – PPO

## 2019-10-09 ENCOUNTER — Ambulatory Visit: Payer: Self-pay | Admitting: Obstetrics & Gynecology

## 2019-10-09 ENCOUNTER — Telehealth: Payer: Self-pay | Admitting: Obstetrics & Gynecology

## 2019-10-09 NOTE — Telephone Encounter (Signed)
Patient arrived late for appointment, needs to be rescheduled.

## 2019-10-09 NOTE — Telephone Encounter (Signed)
Patient sent the following correspondence through [place].  I was late to my appointment - I got lost- got there at 3:07 Was planning on attend but not on getting lost

## 2019-10-10 NOTE — Telephone Encounter (Signed)
Left message to call Sharee Pimple, RN at [place].     PUS for pelvic cramping, ovulatory pain, history of fibroid

## 2019-10-13 NOTE — Telephone Encounter (Signed)
Patient returned call

## 2019-10-13 NOTE — Telephone Encounter (Signed)
Call to patient. Patient PUS rescheduled to 10-30-2019 at 1600 with 1630 consult with Dr. Sabra Heck. Patient agreeable to date and time of appointment.   Routing to provider and will close encounter.

## 2019-10-28 ENCOUNTER — Ambulatory Visit: Payer: BC Managed Care – PPO | Admitting: Internal Medicine

## 2019-10-29 ENCOUNTER — Telehealth: Payer: Self-pay | Admitting: Obstetrics & Gynecology

## 2019-10-29 NOTE — Telephone Encounter (Signed)
Call placed to patient to review benefit for scheduled ultrasound. Left voicemail message requesting a return call.

## 2019-10-30 ENCOUNTER — Ambulatory Visit (INDEPENDENT_AMBULATORY_CARE_PROVIDER_SITE_OTHER): Payer: BC Managed Care – PPO

## 2019-10-30 ENCOUNTER — Other Ambulatory Visit: Payer: Self-pay

## 2019-10-30 ENCOUNTER — Ambulatory Visit: Payer: BC Managed Care – PPO | Admitting: Obstetrics & Gynecology

## 2019-10-30 VITALS — BP 122/74 | HR 76 | Temp 97.4°F | Ht 64.0 in | Wt 163.4 lb

## 2019-10-30 DIAGNOSIS — N946 Dysmenorrhea, unspecified: Secondary | ICD-10-CM | POA: Diagnosis not present

## 2019-10-30 DIAGNOSIS — K581 Irritable bowel syndrome with constipation: Secondary | ICD-10-CM | POA: Diagnosis not present

## 2019-10-30 DIAGNOSIS — R102 Pelvic and perineal pain: Secondary | ICD-10-CM

## 2019-10-30 NOTE — Patient Instructions (Signed)
[email or web address] Mirena IUD  [email or web address] Kyleena IUD

## 2019-10-30 NOTE — Progress Notes (Signed)
43 y.o. [id number] Single  female here for pelvic ultrasound due to increased pelvic cramping.  She does have a hx of small fibroids.  Is continuing to have GI issues.  Followed by Dr. Carlean Purl.  Considering elimination diet.  No LMP recorded.  Contraception: abstinence   Findings:  UTERUS: 7.6 x 5.7 x 6.3cm with at five fibroids, intramural, measuring from 2.5cm to 0.7cm EMS: 9.39mm ADNEXA: Left ovary:  2.0 x 1.6 x 1.5cm       Right ovary: 2.4 x 1.7 x 1.2cm CUL DE SAC: no free fluid  Discussion:  Findings reviewed with pt.  Today she states she is less worried about the cramping which was one of the major issues she expressed at her last visit.  She is not sure she wants to do anything for treatment of this right now.  States the biggest issue for her is concerns about GI issues.  We did discussed possible IUD use and information provided.  She will consider but is doubtful that she wil proceed with this at this time.  Aware symptoms form fibroids will resolve with menopause  Assessment:  Dysmenorrhea/pelvic cramping Fibroid uterus, increased in number and size since prior PUS Constipation H/o proctalgia fugax  Plan:  Options for treatment discussed.  She is not interested in using OCPs and she is concerned about any option that will increase her risk for weight gain.  Progesterone IUD discussed.  She is not sure she wants this either.  Information given.  She knows to call if desires to proceed with this option.  ~20 minutes spent with patient >50% of time was in face to face discussion of above.

## 2019-11-02 ENCOUNTER — Encounter: Payer: Self-pay | Admitting: Obstetrics & Gynecology

## 2019-11-04 ENCOUNTER — Ambulatory Visit: Payer: BC Managed Care – PPO | Admitting: Internal Medicine

## 2019-11-04 ENCOUNTER — Encounter: Payer: Self-pay | Admitting: Internal Medicine

## 2019-11-04 ENCOUNTER — Other Ambulatory Visit: Payer: Self-pay

## 2019-11-04 DIAGNOSIS — Z6827 Body mass index (BMI) 27.0-27.9, adult: Secondary | ICD-10-CM

## 2019-11-04 DIAGNOSIS — K581 Irritable bowel syndrome with constipation: Secondary | ICD-10-CM | POA: Diagnosis not present

## 2019-11-04 DIAGNOSIS — D219 Benign neoplasm of connective and other soft tissue, unspecified: Secondary | ICD-10-CM | POA: Diagnosis not present

## 2019-11-04 DIAGNOSIS — K30 Functional dyspepsia: Secondary | ICD-10-CM

## 2019-11-04 DIAGNOSIS — K594 Anal spasm: Secondary | ICD-10-CM

## 2019-11-04 HISTORY — DX: Functional dyspepsia: K30

## 2019-11-04 NOTE — Assessment & Plan Note (Signed)
Prn FD [place]
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
## 2019-11-04 NOTE — Assessment & Plan Note (Addendum)
Improved Continue MiraLax FODMAPS restriction, triggers etc F/u prn

## 2019-11-04 NOTE — Assessment & Plan Note (Signed)
She is considering OIUD Tx - concerned about weight gain

## 2019-11-04 NOTE — Assessment & Plan Note (Signed)
Has not recurred NTG topical prn

## 2019-11-04 NOTE — Patient Instructions (Signed)
Glad things are improved.  Recommendations:  Continue diet modification - FODMAPS  Use FD Gard as needed   Look at the following nutrition websites - might help you with your weight goals - intermittent fasting may be especially helpful. Will always have to keep in mind to avoid your trigger foods.  There is an app called Fastic for that that I use (free one)  Healthy and nutritious eating and weight loss are made to be harder than they need to be. A simplified diet approach based around eating normally, as much as you want without restricting intake too much is better, I think. You must avoid packaged foods and try to eat real foods as much as possible. Packaged foods, sugary sodas, highly processed foods taste great but are slow poisons that lead to obesity and/or poor health.  It is very helpful to take some time each week and plan your meals. Work with your spouse, partner, family to do this as much as possible. Preparing meals ahead to take to work or school is especially helpful and will save money, too. You can do this and by working together it can take less time.  Another way to help is to order food on-line for pick-up (or delivery if you can afford) and you will avoid impulse buys of unhealthy foods.  Some resources that I like are:  www.gaplesinstitute.org (Do the learning modules about healthy eating)  www.dietdoctor.com - helps with low carb diets and also can learn about and consider intermittent  fasting  Eat 1 serving healthy carb per meal- 1/2 cup brown rice, beans, potato, corn- pay attention to whether or not this significantly raises your blood sugar if you are checking blood sugars. If it does, reduce the frequency you consume these.   Eat 2-3 servings of lower sugar fruits daily.  This includes berries, apples, oranges, peaches, pears, one half banana.  Have small amounts of good fats such as avocado, nuts, olive oil, nut butters, olives.  Add a little cheese to  your salads to make them tasty.   I appreciate the opportunity to care for you. Gatha Mayer, MD, [facility]

## 2019-11-04 NOTE — Assessment & Plan Note (Signed)
Nutrition advice see AVS

## 2019-11-13 ENCOUNTER — Other Ambulatory Visit: Payer: Self-pay | Admitting: Internal Medicine

## 2019-11-13 MED ORDER — HYOSCYAMINE SULFATE 0.125 MG PO TABS: 0.1250 mg | ORAL_TABLET | ORAL | 0 refills | Status: DC | PRN

## 2020-01-08 ENCOUNTER — Other Ambulatory Visit: Payer: Self-pay | Admitting: Internal Medicine

## 2020-01-15 ENCOUNTER — Ambulatory Visit: Payer: BC Managed Care – PPO | Attending: Internal Medicine

## 2020-01-15 DIAGNOSIS — Z23 Encounter for immunization: Secondary | ICD-10-CM | POA: Insufficient documentation

## 2020-01-15 NOTE — Progress Notes (Signed)
   Covid-19 Vaccination Clinic  Name:  Sara Bishop    MRN: [id number] DOB: January 27, 1976  01/15/2020  Sara Bishop was observed post Covid-19 immunization for 30 minutes based on pre-vaccination screening without incidence. She was provided with Vaccine Information Sheet and instruction to access the V-Safe system.   Sara Bishop was instructed to call 911 with any severe reactions post vaccine: Marland Kitchen Difficulty breathing  . Swelling of your face and throat  . A fast heartbeat  . A bad rash all over your body  . Dizziness and weakness    Immunizations Administered    Name Date Dose VIS Date Route   Pfizer COVID-19 Vaccine 01/15/2020 11:26 AM 0.3 mL 10/31/2019 Intramuscular   Manufacturer: [place]   Lot: [id number]   [place]: [id number]

## 2020-02-02 ENCOUNTER — Other Ambulatory Visit: Payer: Self-pay

## 2020-02-02 DIAGNOSIS — F3281 Premenstrual dysphoric disorder: Secondary | ICD-10-CM

## 2020-02-02 DIAGNOSIS — F341 Dysthymic disorder: Secondary | ICD-10-CM

## 2020-02-02 DIAGNOSIS — F411 Generalized anxiety disorder: Secondary | ICD-10-CM

## 2020-02-02 MED ORDER — VENLAFAXINE HCL ER 75 MG PO CP24: 75.0000 mg | ORAL_CAPSULE | Freq: Every day | ORAL | 0 refills | Status: DC

## 2020-02-10 ENCOUNTER — Ambulatory Visit: Payer: BC Managed Care – PPO | Attending: Internal Medicine

## 2020-02-10 DIAGNOSIS — Z23 Encounter for immunization: Secondary | ICD-10-CM

## 2020-02-10 NOTE — Progress Notes (Signed)
   Covid-19 Vaccination Clinic  Name:  Alette Schafer    MRN: [id number] DOB: Nov 19, 1976  02/10/2020  Ms. Carrothers was observed post Covid-19 immunization for 30 minutes based on pre-vaccination screening without incident. She was provided with Vaccine Information Sheet and instruction to access the V-Safe system.   Ms. Altimari was instructed to call 911 with any severe reactions post vaccine: Marland Kitchen Difficulty breathing  . Swelling of face and throat  . A fast heartbeat  . A bad rash all over body  . Dizziness and weakness   Immunizations Administered    Name Date Dose VIS Date Route   Pfizer COVID-19 Vaccine 02/10/2020  8:54 AM 0.3 mL 10/31/2019 Intramuscular   Manufacturer: [place]   Lot: [id number]   [place]: [id number]

## 2020-03-05 ENCOUNTER — Other Ambulatory Visit: Payer: Self-pay | Admitting: Psychiatry

## 2020-03-05 DIAGNOSIS — F3281 Premenstrual dysphoric disorder: Secondary | ICD-10-CM

## 2020-03-05 DIAGNOSIS — F411 Generalized anxiety disorder: Secondary | ICD-10-CM

## 2020-03-05 DIAGNOSIS — F341 Dysthymic disorder: Secondary | ICD-10-CM

## 2020-03-13 ENCOUNTER — Encounter (HOSPITAL_COMMUNITY): Payer: Self-pay | Admitting: Family Medicine

## 2020-03-13 ENCOUNTER — Other Ambulatory Visit: Payer: Self-pay

## 2020-03-13 ENCOUNTER — Ambulatory Visit (HOSPITAL_COMMUNITY)
Admission: EM | Admit: 2020-03-13 | Discharge: 2020-03-13 | Disposition: A | Discharge status: Home / Self Care | Payer: BC Managed Care – PPO | Attending: Family Medicine | Admitting: Family Medicine

## 2020-03-13 ENCOUNTER — Ambulatory Visit (INDEPENDENT_AMBULATORY_CARE_PROVIDER_SITE_OTHER)
Admission: RE | Admit: 2020-03-13 | Discharge: 2020-03-13 | Disposition: A | Discharge status: Other Acute Inpatient Hospital | Payer: BC Managed Care – PPO | Source: Ambulatory Visit

## 2020-03-13 DIAGNOSIS — N760 Acute vaginitis: Secondary | ICD-10-CM | POA: Diagnosis present

## 2020-03-13 DIAGNOSIS — N949 Unspecified condition associated with female genital organs and menstrual cycle: Secondary | ICD-10-CM

## 2020-03-13 DIAGNOSIS — R0789 Other chest pain: Secondary | ICD-10-CM

## 2020-03-13 DIAGNOSIS — R070 Pain in throat: Secondary | ICD-10-CM | POA: Diagnosis not present

## 2020-03-13 MED ORDER — FLUCONAZOLE 150 MG PO TABS: 150.0000 mg | ORAL_TABLET | Freq: Once | ORAL | 0 refills | Status: AC

## 2020-03-13 MED ORDER — PREDNISONE 50 MG PO TABS: ORAL_TABLET | ORAL | 1 refills | Status: DC
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
## 2020-03-13 NOTE — Discharge Instructions (Addendum)
You appear to have chest wall strain from the lifting and moving boxes a month ago.  This can take over a month to resolve.  Prednisone once a day should help.  There is no sign of STD or Herpes.  Instead, you appear to have a yeast vaginitis which should resolve with the medicine prescribed (one fluconazole will keep working for a week)

## 2020-03-13 NOTE — ED Triage Notes (Signed)
Pt c/o 2/10 non radiating mid sternal chest painx1 mo. Pt states chest hurts when she sneezes. Pt denies SOB, N/V. Pt states 1/10 burning vaginal pain. Pt c/o whitish dischargex1.5 wks.
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
## 2020-03-15 LAB — CERVICOVAGINAL ANCILLARY ONLY
Chlamydia: NEGATIVE
Comment: NEGATIVE
Comment: NEGATIVE
Comment: NORMAL
Neisseria Gonorrhea: NEGATIVE
Trichomonas: NEGATIVE

## 2020-03-16 ENCOUNTER — Encounter: Payer: Self-pay | Admitting: Psychiatry

## 2020-03-16 ENCOUNTER — Other Ambulatory Visit: Payer: Self-pay

## 2020-03-16 ENCOUNTER — Ambulatory Visit (INDEPENDENT_AMBULATORY_CARE_PROVIDER_SITE_OTHER): Payer: BC Managed Care – PPO | Admitting: Psychiatry

## 2020-03-16 VITALS — Ht 64.0 in | Wt 150.0 lb

## 2020-03-16 DIAGNOSIS — F3281 Premenstrual dysphoric disorder: Secondary | ICD-10-CM

## 2020-03-16 DIAGNOSIS — F341 Dysthymic disorder: Secondary | ICD-10-CM

## 2020-03-16 DIAGNOSIS — F411 Generalized anxiety disorder: Secondary | ICD-10-CM | POA: Diagnosis not present

## 2020-03-16 MED ORDER — VENLAFAXINE HCL ER 75 MG PO CP24: 75.0000 mg | ORAL_CAPSULE | Freq: Every day | ORAL | 1 refills | Status: DC

## 2020-03-16 MED ORDER — ESCITALOPRAM OXALATE 20 MG PO TABS: 20.0000 mg | ORAL_TABLET | Freq: Every day | ORAL | 1 refills | Status: DC
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
## 2020-03-30 ENCOUNTER — Telehealth: Payer: BC Managed Care – PPO | Admitting: Family

## 2020-03-30 DIAGNOSIS — R6889 Other general symptoms and signs: Secondary | ICD-10-CM | POA: Diagnosis not present

## 2020-03-30 MED ORDER — OSELTAMIVIR PHOSPHATE 75 MG PO CAPS: 75.0000 mg | ORAL_CAPSULE | Freq: Two times a day (BID) | ORAL | 0 refills | Status: DC

## 2020-03-30 NOTE — Progress Notes (Signed)

## 2020-05-02 ENCOUNTER — Ambulatory Visit (HOSPITAL_COMMUNITY)
Admission: EM | Admit: 2020-05-02 | Discharge: 2020-05-02 | Disposition: A | Discharge status: Home / Self Care | Payer: BC Managed Care – PPO | Attending: Emergency Medicine | Admitting: Emergency Medicine

## 2020-05-02 ENCOUNTER — Encounter (HOSPITAL_COMMUNITY): Payer: Self-pay

## 2020-05-02 ENCOUNTER — Other Ambulatory Visit: Payer: Self-pay

## 2020-05-02 DIAGNOSIS — N760 Acute vaginitis: Secondary | ICD-10-CM | POA: Diagnosis not present

## 2020-05-02 MED ORDER — FLUCONAZOLE 150 MG PO TABS
150.0000 mg | ORAL_TABLET | Freq: Once | ORAL | 0 refills | Status: AC
Start: 2020-05-02 — End: 2020-05-02
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
## 2020-05-02 NOTE — Discharge Instructions (Signed)
Take 1 tab diflucan, repeat in 3-4 days if needed  We are testing you for herpes, Gonorrhea, Chlamydia, Trichomonas, Yeast and Bacterial Vaginosis. We will call you if anything is positive and let you know if you require any further treatment. Please inform partners of any positive results.   Please return if symptoms not improving with treatment, development of fever, nausea, vomiting, abdominal pain.

## 2020-05-03 LAB — CERVICOVAGINAL ANCILLARY ONLY
Bacterial Vaginitis (gardnerella): NEGATIVE
Candida Glabrata: NEGATIVE
Candida Vaginitis: NEGATIVE
Chlamydia: NEGATIVE
Comment: NEGATIVE
Comment: NEGATIVE
Comment: NEGATIVE
Comment: NEGATIVE
Comment: NEGATIVE
Comment: NORMAL
Neisseria Gonorrhea: NEGATIVE
Trichomonas: NEGATIVE

## 2020-05-04 LAB — HSV CULTURE AND TYPING

## 2020-05-06 ENCOUNTER — Other Ambulatory Visit: Payer: Self-pay
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
## 2020-05-07 ENCOUNTER — Encounter: Payer: Self-pay | Admitting: Obstetrics & Gynecology

## 2020-05-07 ENCOUNTER — Ambulatory Visit: Payer: BC Managed Care – PPO | Admitting: Obstetrics & Gynecology

## 2020-05-07 VITALS — BP 102/64 | HR 68 | Temp 97.5°F | Resp 16 | Ht 63.75 in | Wt 154.0 lb

## 2020-05-07 DIAGNOSIS — Z01419 Encounter for gynecological examination (general) (routine) without abnormal findings: Secondary | ICD-10-CM | POA: Diagnosis not present

## 2020-05-07 DIAGNOSIS — Z0000 Encounter for general adult medical examination without abnormal findings: Secondary | ICD-10-CM | POA: Diagnosis not present

## 2020-05-07 DIAGNOSIS — Z23 Encounter for immunization: Secondary | ICD-10-CM | POA: Diagnosis not present

## 2020-05-08 LAB — COMPREHENSIVE METABOLIC PANEL
ALT: 12 IU/L (ref 0–32)
AST: 17 IU/L (ref 0–40)
Albumin/Globulin Ratio: 1.4 (ref 1.2–2.2)
Albumin: 4 g/dL (ref 3.8–4.8)
Alkaline Phosphatase: 65 IU/L (ref 48–121)
BUN/Creatinine Ratio: 14 (ref 9–23)
BUN: 11 mg/dL (ref 6–24)
Bilirubin Total: 0.2 mg/dL (ref 0.0–1.2)
CO2: 23 mmol/L (ref 20–29)
Calcium: 9 mg/dL (ref 8.7–10.2)
Chloride: 101 mmol/L (ref 96–106)
Creatinine, Ser: 0.79 mg/dL (ref 0.57–1.00)
GFR calc Af Amer: 105 mL/min/{1.73_m2} (ref >59)
GFR calc non Af Amer: 91 mL/min/{1.73_m2} (ref >59)
Globulin, Total: 2.9 g/dL (ref 1.5–4.5)
Glucose: 78 mg/dL (ref 65–99)
Potassium: 4.1 mmol/L (ref 3.5–5.2)
Sodium: 137 mmol/L (ref 134–144)
Total Protein: 6.9 g/dL (ref 6.0–8.5)

## 2020-05-08 LAB — CBC
Hematocrit: 37.8 % (ref 34.0–46.6)
Hemoglobin: 13.1 g/dL (ref 11.1–15.9)
MCH: 31.3 pg (ref 26.6–33.0)
MCHC: 34.7 g/dL (ref 31.5–35.7)
MCV: 90 fL (ref 79–97)
Platelets: 306 10*3/uL (ref 150–450)
RBC: 4.19 x10E6/uL (ref 3.77–5.28)
RDW: 12.1 % (ref 11.7–15.4)
WBC: 5.9 10*3/uL (ref 3.4–10.8)

## 2020-05-08 LAB — LIPID PANEL
Chol/HDL Ratio: 3.9 ratio (ref 0.0–4.4)
Cholesterol, Total: 274 mg/dL — ABNORMAL HIGH (ref 100–199)
HDL: 70 mg/dL (ref >39)
LDL Chol Calc (NIH): 188 mg/dL — ABNORMAL HIGH (ref 0–99)
Triglycerides: 96 mg/dL (ref 0–149)
VLDL Cholesterol Cal: 16 mg/dL (ref 5–40)

## 2020-05-08 LAB — TSH: TSH: 2.12 u[IU]/mL (ref 0.450–4.500)

## 2020-05-08 LAB — VITAMIN D 25 HYDROXY (VIT D DEFICIENCY, FRACTURES): Vit D, 25-Hydroxy: 24.8 ng/mL — ABNORMAL LOW (ref 30.0–100.0)

## 2020-05-13 ENCOUNTER — Telehealth: Payer: Self-pay | Admitting: Internal Medicine

## 2020-05-13 MED ORDER — HYOSCYAMINE SULFATE 0.125 MG PO TABS: 0.1250 mg | ORAL_TABLET | ORAL | 1 refills | Status: DC | PRN

## 2020-05-13 NOTE — Telephone Encounter (Signed)
I called Sara Bishop and she found some in her purse. She requested I send the refill for the hyoscyamine to [facility] local [facility], which I have done.

## 2020-05-13 NOTE — Telephone Encounter (Signed)
Ok to refill as written in chart

## 2020-05-13 NOTE — Telephone Encounter (Signed)
Okay Sir? 

## 2020-09-06 ENCOUNTER — Ambulatory Visit (INDEPENDENT_AMBULATORY_CARE_PROVIDER_SITE_OTHER): Payer: BC Managed Care – PPO | Admitting: Psychiatry

## 2020-09-06 ENCOUNTER — Encounter: Payer: Self-pay | Admitting: Psychiatry

## 2020-09-06 ENCOUNTER — Other Ambulatory Visit: Payer: Self-pay

## 2020-09-06 VITALS — Ht 64.0 in | Wt 156.0 lb

## 2020-09-06 DIAGNOSIS — F3281 Premenstrual dysphoric disorder: Secondary | ICD-10-CM

## 2020-09-06 DIAGNOSIS — F411 Generalized anxiety disorder: Secondary | ICD-10-CM | POA: Diagnosis not present

## 2020-09-06 DIAGNOSIS — F341 Dysthymic disorder: Secondary | ICD-10-CM

## 2020-09-06 MED ORDER — VENLAFAXINE HCL ER 75 MG PO CP24: 75.0000 mg | ORAL_CAPSULE | Freq: Every day | ORAL | 1 refills | Status: DC

## 2020-09-06 MED ORDER — BUPROPION HCL ER (XL) 150 MG PO TB24: 150.0000 mg | ORAL_TABLET | Freq: Every day | ORAL | 1 refills | Status: DC
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
## 2020-09-09 ENCOUNTER — Encounter: Payer: Self-pay | Admitting: Psychiatry

## 2020-09-15 ENCOUNTER — Ambulatory Visit: Payer: BC Managed Care – PPO | Admitting: Psychiatry

## 2020-11-04 ENCOUNTER — Telehealth: Payer: Self-pay | Admitting: Psychiatry

## 2020-11-04 DIAGNOSIS — F341 Dysthymic disorder: Secondary | ICD-10-CM

## 2020-11-04 DIAGNOSIS — F411 Generalized anxiety disorder: Secondary | ICD-10-CM

## 2020-11-04 DIAGNOSIS — F3281 Premenstrual dysphoric disorder: Secondary | ICD-10-CM

## 2020-11-04 MED ORDER — NORTRIPTYLINE HCL 50 MG PO CAPS: 50.0000 mg | ORAL_CAPSULE | Freq: Every day | ORAL | 0 refills | Status: DC

## 2020-11-04 NOTE — Telephone Encounter (Signed)
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  Over the last 2 months, her menses have been worse for crying irritability and dysphoric moodiness including the last 2 days.  GYN and Dr. Enis Gash her psychologist are helping but consider that she needs a medication change.  Wellbutrin has not been helpful and in fact she has gained 3 pounds in her morning weight rather than containing her weight gain.  She requests medication change without coming in to the office.  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  She tolerated Lexapro and Effexor together but without adequate efficacy from the Lexapro.  We process prevention and monitoring safety hygiene for such changes and crisis plans if needed.  She is scheduled to see Thayer Headings, PMHNP in 4 months and does not wish to advance that appointment or see me before retirement.  Considering all issues, we plan Pamelor 50 mg capsule to take 1 every other night for 2 doses then 1 nightly sent as #30 with no refill to [facility] to replace Wellbutrin to be stopped abruptly while continuing the Effexor 75 mg XR every morning.

## 2020-11-04 NOTE — Telephone Encounter (Signed)
Pt called and and said that the medicine is not working. Crying all the time , going crazy. Really struggling [facility] [address]. The wellbutrin xl not working. Please call her  At [phone]

## 2020-11-28 ENCOUNTER — Other Ambulatory Visit: Payer: Self-pay | Admitting: Psychiatry

## 2020-11-28 DIAGNOSIS — F3281 Premenstrual dysphoric disorder: Secondary | ICD-10-CM

## 2020-11-28 DIAGNOSIS — F411 Generalized anxiety disorder: Secondary | ICD-10-CM

## 2020-11-28 DIAGNOSIS — F341 Dysthymic disorder: Secondary | ICD-10-CM

## 2020-12-01 NOTE — Telephone Encounter (Signed)
Okay to send a 90 day? Has apt in April 2022  Dr. Creig Hines saw her 08/2020

## 2020-12-09 ENCOUNTER — Telehealth: Payer: Self-pay | Admitting: Psychiatry

## 2020-12-09 DIAGNOSIS — F3281 Premenstrual dysphoric disorder: Secondary | ICD-10-CM

## 2020-12-09 DIAGNOSIS — F411 Generalized anxiety disorder: Secondary | ICD-10-CM

## 2020-12-09 DIAGNOSIS — F341 Dysthymic disorder: Secondary | ICD-10-CM

## 2020-12-09 MED ORDER — NORTRIPTYLINE HCL 10 MG PO CAPS: ORAL_CAPSULE | ORAL | 0 refills | Status: DC

## 2020-12-09 NOTE — Telephone Encounter (Signed)
Next appt is 03/15/21.Sara Bishop is having trouble with Venlafaxine which makes her constipated and Nortriptyline makes her feel like a zombie. She is calling asking for advice on tapering off of these. She doesn't want to take the medicines anymore. Her number is [phone].

## 2020-12-09 NOTE — Telephone Encounter (Signed)
Next appt is 03/15/21. Transfer from Dr. Creig Hines. She is having problems with the Bupropion and Venlafaxine.

## 2020-12-19 NOTE — Progress Notes (Addendum)
12/19/2020 Sara Bishop [id number] 1976-08-30   Chief Complaint: Upper abdominal pain   History of Present Illness: Sara Bishop is a 45 year old female with a past medial history of anxiety, depression, uterine fibroids, functional dyspepsia, proctalgia fugax and chronic constipation. S/P cholecystectomy 7048, umbilical hernia repair and tonsillectomy. She was last seen in office by Dr. Carlean Purl on 11/04/2019. At that time, her constipation was well controlled on Miralax without further issues regarding proctalgia fujax.  She presents today for further evaluation regarding upper pain which radiates through to her back started around 11/12/2020.  She presented to [facility] ED on 12/12/2020 with worsening left upper quadrant abdominal pain which radiated up into her back between her shoulder blades.  She had some nausea without vomiting.  She had one episode of loose stools without watery diarrhea.  No fever. Labs in the ED showed Na 139. K+ 4.0. BUN 9. Cr. 0.77. Ca 10.3. Alk phos 73. AST 15. ALT 13. T. Bili 0.39.  WBC 8.0.  Hemoglobin 13.0.  Hematocrit 39.3.  MCV 93.  Platelet 337.  An abdominal/pelvic CT without contrast (patient reports history of allergy to CT IV contrast) showed possible very mild acute uncomplicated diverticulitis at the splenic flexure versus possible very mild tail pancreatitis.  She was prescribed Cipro 500 mg p.o. twice daily and Metronidazole 500 mg p.o. twice daily for possible diverticulitis and Zofran 4 mg p.o. every 6 hours for nausea.  She was also prescribed Norco 5/325 mg 1-2 tabs p.o. every 6 hours, 3-day supply dispensed.  Currently, she continues to have nausea without vomiting  She has has intermittent epigastric pain and left upper quadrant pain which radiates into the upper back and shoulder blade area.  Her upper abdominal and back pain are somewhat worse after eating but does not interfere or awaken her from sleep.  She has infrequent brief twinges of  lower abdominal pain.  She stated her current upper abdominal and back pain are similar to the pain she had prior to her cholecystectomy in 2018.  No dysphagia or heartburn.  She denies NSAID use.  No alcohol use.  She is passing a lighter colored soft brown stool daily.  No rectal bleeding or melena.  No fever, sweats or chills.  She underwent an EGD 09/29/2019 which showed chronic gastritis without evidence of H. pylori.  She underwent a colonoscopy 05/10/2017 by Dr. Paulita Fujita which identified a 6 mm tubular adenomatous polyp which was removed from the ascending colon and internal hemorrhoids.  She is due for repeat colonoscopy June/2023.  EGD 09/29/2019 by Dr. Carlean Purl: - Erythematous mucosa in the prepyloric region of the stomach.  Biopsies showed mild chronic gastritis, no evidence of H. pylori. - The examination was otherwise normal. - Biopsies were taken with a cold forceps for histology in the gastric body and in the entire Duodenum.  Biopsies without evidence of celiac disease.  Colonoscopy 05/10/2017 by Dr. Paulita Fujita: A 59m tubular adenomatous polyp was found in the ascending colon.  The polyp was sessile.  The polyp was removed with a hot snare.  Resection and retrieval were complete. Internal hemorrhoids were found during retroflexion.  The hemorrhoids were mild. -5 year recall colonoscopy   CTAP without contrast 12/12/2020: Lung bases clear. No nodule or consolidation seen. No pleural effusion or pneumothorax. Bilateral breast implants ABDOMEN AND PELVIS: . Liver: Normal . Gallbladder: Absent . Biliary: Normal . Pancreas: Normal . Adrenals: Normal . Spleen: Normal . Kidneys and ureters:Normal No hydronephrosis or stones .  Stomach, small bowel, and large bowel: No wall thickening or obstruction. No inflammatory changes. Mild colonic diverticulosis. Possible trace pericolic inflammatory changes at the splenic flexure.. No abscess or perforation. Marland Kitchen Appendix: Normal . Peritoneum: No free  fluid or free air . Lymph nodes: No lymphadenopathy . Urinary bladder: Normal . Reproductive tract: Normal for age . Vascular: Incompletely assessed without contrast. No evidence of aneurysm. . Musculoskeletal:No aggressive lesions or fractures Impression: Possible very mild acute uncomplicated diverticulitis at the splenic flexure, versus possible very mild tail pancreatitis.    CBC Latest Ref Rng & Units 05/07/2020 11/21/2016  WBC 3.4 - 10.8 x10E3/uL 5.9 6.3  Hemoglobin 11.1 - 15.9 g/dL 13.1 13.2  Hematocrit 34.0 - 46.6 % 37.8 38.8  Platelets 150 - 450 x10E3/uL 306 265   CMP Latest Ref Rng & Units 05/07/2020 11/21/2016  Glucose 65 - 99 mg/dL 78 81  BUN 6 - 24 mg/dL 11 15  Creatinine 0.57 - 1.00 mg/dL 0.79 0.69  Sodium 134 - 144 mmol/L 137 137  Potassium 3.5 - 5.2 mmol/L 4.1 3.7  Chloride 96 - 106 mmol/L 101 105  CO2 20 - 29 mmol/L 23 25  Calcium 8.7 - 10.2 mg/dL 9.0 9.1  Total Protein 6.0 - 8.5 g/dL 6.9 6.8  Total Bilirubin 0.0 - 1.2 mg/dL 0.2 0.4  Alkaline Phos 48 - 121 IU/L 65 54  AST 0 - 40 IU/L 17 21  ALT 0 - 32 IU/L 12 24  TSH 2.120  EGD 09/29/2019: - Erythematous mucosa in the prepyloric region of the stomach.  Biopsies showed mild chronic gastritis, no evidence of H. pylori. - The examination was otherwise normal. - Biopsies were taken with a cold forceps for histology in the gastric body and in the entire Duodenum.  Biopsies without evidence of celiac disease.  Colonoscopy 05/10/2017 by Dr. Paulita Fujita: A 23m tubular adenomatous polyp was found in the ascending colon.  The polyp was sessile.  The polyp was removed with a hot snare.  Resection and retrieval were complete. Internal hemorrhoids were found during retroflexion.  The hemorrhoids were mild. -5 year recall colonoscopy    Current Medications, Allergies, Past Medical History, Past Surgical History, Family History and Social History were reviewed in [employer].   Review of Systems:    Constitutional: Negative for fever, sweats, chills or weight loss.  Respiratory: Negative for shortness of breath.   Cardiovascular: Negative for chest pain, palpitations and leg swelling.  Gastrointestinal: See HPI.  Musculoskeletal: Negative for back pain or muscle aches.  Neurological: Negative for dizziness, headaches or paresthesias.    Physical Exam: BP 112/78 (BP Location: Left Arm, Patient Position: Sitting, Cuff Size: Normal)   Pulse 76   Ht '5\' 4"'  (1.626 m)   Wt 153 lb 8 oz (69.6 kg)   BMI 26.35 kg/m   General: Well developed 45year old female in no acute distress. Head: Normocephalic and atraumatic. Eyes: No scleral icterus. Conjunctiva pink . Ears: Normal auditory acuity. Mouth: Dentition intact. No ulcers or lesions.  Lungs: Clear throughout to auscultation. Heart: Regular rate and rhythm, no murmur. Abdomen: Soft, nontender and nondistended. No masses or hepatomegaly. Normal bowel sounds x 4 quadrants.  Rectal: Deferred. Musculoskeletal: Symmetrical with no gross deformities. Extremities: No edema. Neurological: Alert oriented x 4. No focal deficits.  Psychological: Alert and cooperative. Normal mood and affect  Assessment and Recommendations: 89  45year old female past cholecystectomy presents for further evaluation regarding epigastric, left upper quadrant abdominal pain which radiates through  to the back with nausea and no vomiting.  Patient reports the symptoms are similar to the symptoms she had prior to her cholecystectomy.  CTAP without contrast in the ED 12/12/2020 showed possible very mild acute uncomplicated diverticulitis at the splenic flexure, versus possible very mild tail pancreatitis.  -Stop Cipro and metronidazole as her symptoms have not improved on this regimen -Abdominal MRI with MRCP to further evaluate the biliary tree and pancreas as her symptoms are consistent with biliary etiology and less likely diverticulitis -CBC, CMP, lipase and amylase  level -Patient requested additional Norco for pain management, advised the patient to avoid further opioid use at this time.  I advised her to increase her hyoscyamine to 2 tabs under the tongue every 6 to 8 hours as needed. -Avoid fatty food -Famotidine 20 mg 1 p.o. daily OTC -Patient to call our office if symptoms worsen -The recommendation to be determined after the above evaluation completed -If her abdominal MRI/MRCP are negative will consider repeat EGD +/- colonoscopy  2.  History of a tubular adenomatous colon polyp identified at the time of her colonoscopy 04/2017 -Next colonoscopy due 04/2022, earlier if symptoms warrant  Primary gastroenterologist Agree with the above evaluation and plan.  Labs returned normal but I still think reasonable to proceed with MRI.  1 note next colonoscopy probably really not due until 04/2024 on a routine basis as diminutive adenoma repeat colonoscopy now 7 to 10 years.  Gatha Mayer, MD, [facility]

## 2020-12-20 ENCOUNTER — Other Ambulatory Visit (INDEPENDENT_AMBULATORY_CARE_PROVIDER_SITE_OTHER): Payer: BC Managed Care – PPO

## 2020-12-20 ENCOUNTER — Ambulatory Visit: Payer: BC Managed Care – PPO | Admitting: Nurse Practitioner

## 2020-12-20 ENCOUNTER — Encounter: Payer: Self-pay | Admitting: Nurse Practitioner

## 2020-12-20 ENCOUNTER — Other Ambulatory Visit: Payer: Self-pay

## 2020-12-20 VITALS — BP 112/78 | HR 76 | Ht 64.0 in | Wt 153.5 lb

## 2020-12-20 DIAGNOSIS — R110 Nausea: Secondary | ICD-10-CM

## 2020-12-20 DIAGNOSIS — K8050 Calculus of bile duct without cholangitis or cholecystitis without obstruction: Secondary | ICD-10-CM

## 2020-12-20 LAB — CBC WITH DIFFERENTIAL/PLATELET
Basophils Absolute: 0.1 10*3/uL (ref 0.0–0.1)
Basophils Relative: 1.4 % (ref 0.0–3.0)
Eosinophils Absolute: 0 10*3/uL (ref 0.0–0.7)
Eosinophils Relative: 0.6 % (ref 0.0–5.0)
HCT: 38.5 % (ref 36.0–46.0)
Hemoglobin: 13 g/dL (ref 12.0–15.0)
Lymphocytes Relative: 34.9 % (ref 12.0–46.0)
Lymphs Abs: 2.6 10*3/uL (ref 0.7–4.0)
MCHC: 33.7 g/dL (ref 30.0–36.0)
MCV: 90.2 fl (ref 78.0–100.0)
Monocytes Absolute: 0.5 10*3/uL (ref 0.1–1.0)
Monocytes Relative: 7 % (ref 3.0–12.0)
Neutro Abs: 4.2 10*3/uL (ref 1.4–7.7)
Neutrophils Relative %: 56.1 % (ref 43.0–77.0)
Platelets: 274 10*3/uL (ref 150.0–400.0)
RBC: 4.27 Mil/uL (ref 3.87–5.11)
RDW: 13.8 % (ref 11.5–15.5)
WBC: 7.5 10*3/uL (ref 4.0–10.5)

## 2020-12-20 LAB — COMPREHENSIVE METABOLIC PANEL
ALT: 9 U/L (ref 0–35)
AST: 16 U/L (ref 0–37)
Albumin: 4.4 g/dL (ref 3.5–5.2)
Alkaline Phosphatase: 45 U/L (ref 39–117)
BUN: 6 mg/dL (ref 6–23)
CO2: 25 mEq/L (ref 19–32)
Calcium: 9.2 mg/dL (ref 8.4–10.5)
Chloride: 105 mEq/L (ref 96–112)
Creatinine, Ser: 0.8 mg/dL (ref 0.40–1.20)
GFR: 88.57 mL/min (ref >60.00)
Glucose, Bld: 87 mg/dL (ref 70–99)
Potassium: 3.6 mEq/L (ref 3.5–5.1)
Sodium: 137 mEq/L (ref 135–145)
Total Bilirubin: 0.6 mg/dL (ref 0.2–1.2)
Total Protein: 7.3 g/dL (ref 6.0–8.3)

## 2020-12-20 LAB — LIPASE: Lipase: 22 U/L (ref 11.0–59.0)

## 2020-12-20 LAB — AMYLASE: Amylase: 47 U/L (ref 27–131)

## 2020-12-20 MED ORDER — OMEPRAZOLE 40 MG PO CPDR
40.0000 mg | DELAYED_RELEASE_CAPSULE | Freq: Every day | ORAL | 1 refills | Status: DC
Start: 2020-12-20 — End: 2021-01-19

## 2020-12-20 NOTE — Patient Instructions (Addendum)
If you are age 46 or younger, your body mass index should be between 19-25. Your Body mass index is 26.35 kg/m. If this is out of the aformentioned range listed, please consider follow up with your Primary Care Provider.   LABS:   Your provider has requested that you go to the basement level for lab work before leaving today. Press "B" on the elevator. The lab is located at the first door on the left as you exit the elevator.  HEALTHCARE LAWS AND MY CHART RESULTS: Due to recent changes in healthcare laws, you may see the results of your imaging and laboratory studies on MyChart before your provider has had a chance to review them.   We understand that in some cases there may be results that are confusing or concerning to you. Not all laboratory results come back in the same time frame and the provider may be waiting for multiple results in order to interpret others.  Please give [place] 48 hours in order for your provider to thoroughly review all the results before contacting the office for clarification of your results.   MRI  You have been scheduled for an MRI at [facility] on 12/24/20. Your appointment time is 7:00 AM.  Please arrive at 6:30 AM for registration purposes. Please make certain not to have anything to eat or drink after midnight prior to your test. In addition, if you have any metal in your body, have a pacemaker or defibrillator, please be sure to let your ordering physician know. This test typically takes 45 minutes to 1 hour to complete. Should you need to reschedule, please call [phone] to do so.  Please stop taking Cipro and Flagyl.  Continue taking the Hyosyamine every 6 hours.  MEDICATION  We have sent the following medication to your pharmacy for you to pick up at your convenience:  Omeprazole 40 MG once a day.  Please call our office if your symptoms worsen.  It was great seeing you today!  Thank you for entrusting me with your care and choosing [facility].  Noralyn Pick, CRNP

## 2020-12-24 ENCOUNTER — Other Ambulatory Visit: Payer: Self-pay

## 2020-12-24 ENCOUNTER — Ambulatory Visit (HOSPITAL_COMMUNITY): Payer: BC Managed Care – PPO

## 2020-12-24 ENCOUNTER — Other Ambulatory Visit: Payer: Self-pay | Admitting: Nurse Practitioner

## 2020-12-24 ENCOUNTER — Ambulatory Visit (HOSPITAL_COMMUNITY)
Admission: RE | Admit: 2020-12-24 | Discharge: 2020-12-24 | Disposition: A | Discharge status: Home / Self Care | Payer: BC Managed Care – PPO | Source: Ambulatory Visit | Attending: Nurse Practitioner | Admitting: Nurse Practitioner

## 2020-12-24 DIAGNOSIS — K8050 Calculus of bile duct without cholangitis or cholecystitis without obstruction: Secondary | ICD-10-CM | POA: Diagnosis present

## 2020-12-24 DIAGNOSIS — R110 Nausea: Secondary | ICD-10-CM

## 2020-12-24 IMAGING — MR MR MRCP
4 of 11 series · 20 of 48 positions shown · non-contrast
Comparison: CT AP [DATE]

CLINICAL DATA: Nausea.  Biliary colic.

EXAM:
MRI ABDOMEN WITHOUT CONTRAST  (INCLUDING MRCP)
TECHNIQUE: Multiplanar multisequence MR imaging of the abdomen was performed.
Heavily T2-weighted images of the biliary and pancreatic ducts were
obtained, and three-dimensional MRCP images were rendered by post
processing.

[Series 3: T2 fat-sat · axial · 6.0mm · 1.19mm/px · z∈[-195,+57]mm · 4 of 36 slices shown]
[im 1/36]
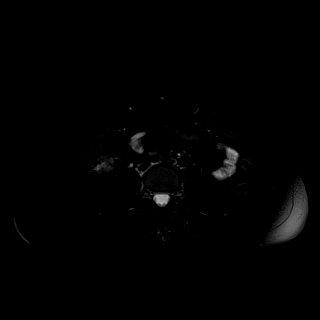
[im 12/36]
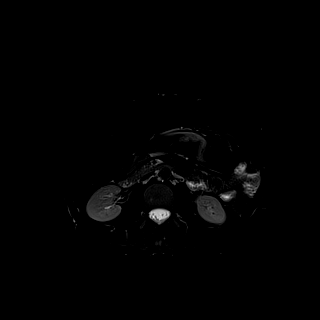
[im 24/36]
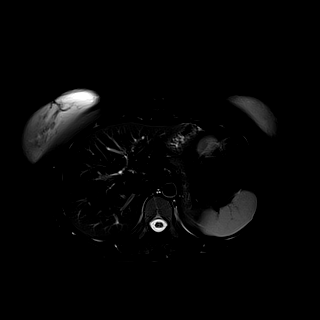
[im 36/36]
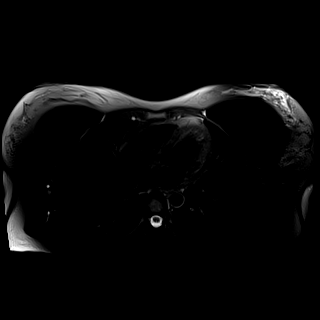

[Series 6: cor_3d_spc_trig · coronal · 1.0mm · 0.49mm/px · 7 of 80 slices shown]
[im 1/80]
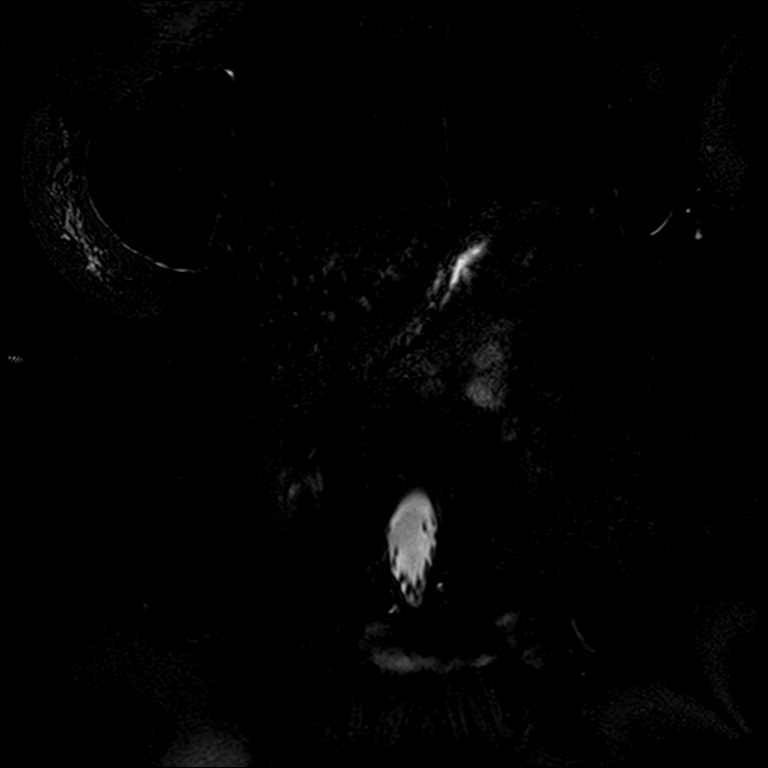
[im 14/80]
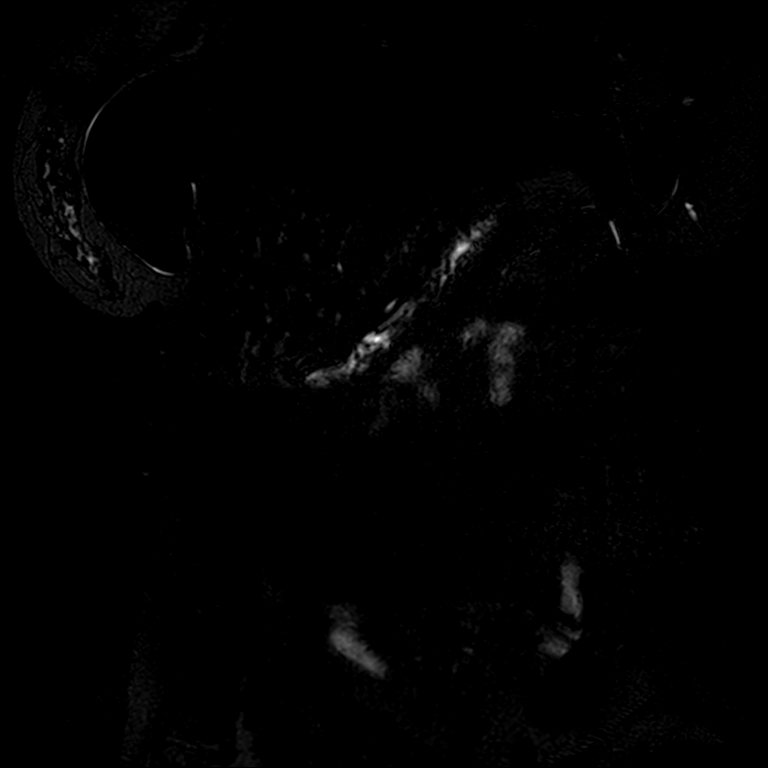
[im 27/80]
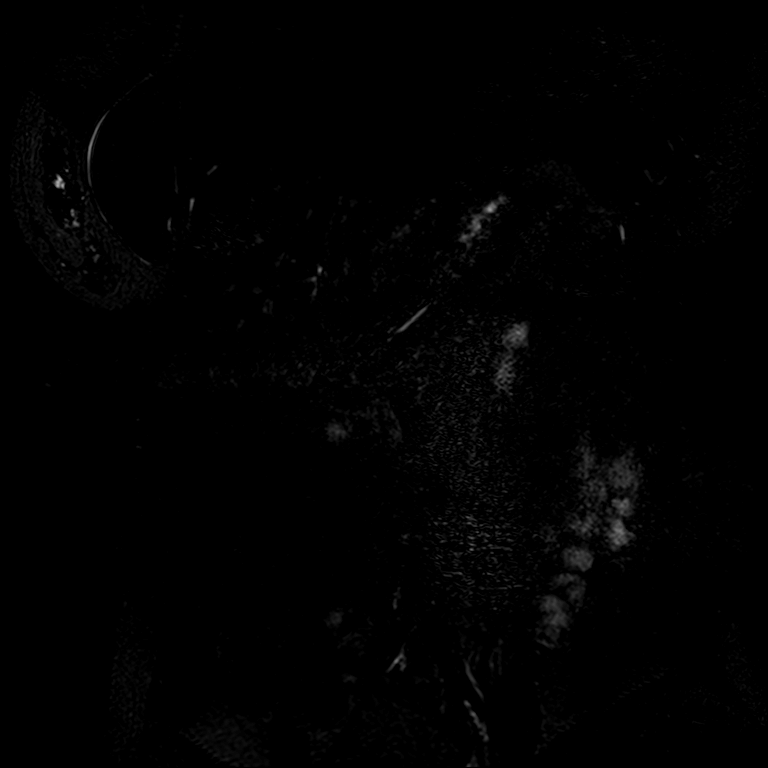
[im 40/80]
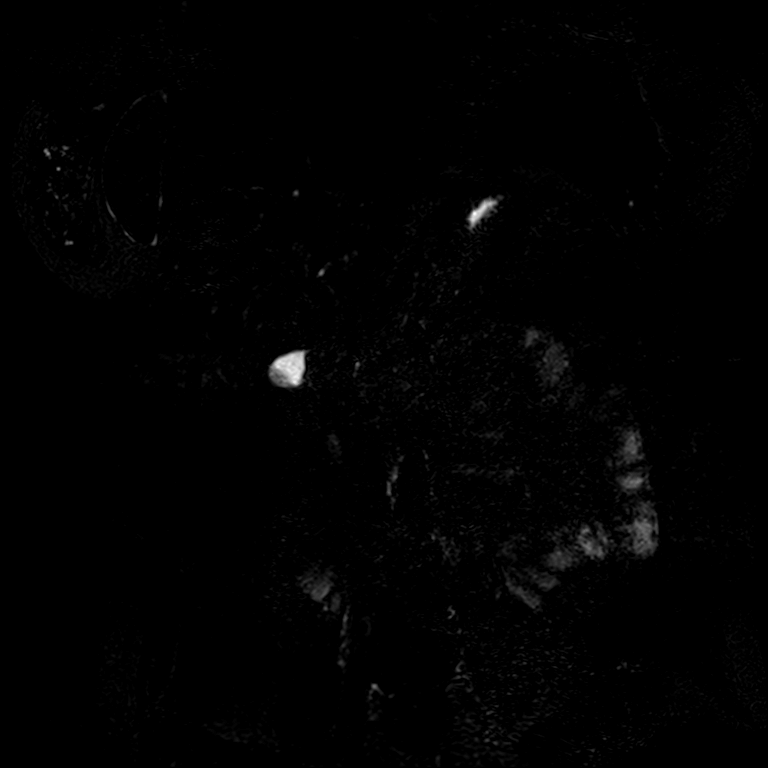
[im 53/80]
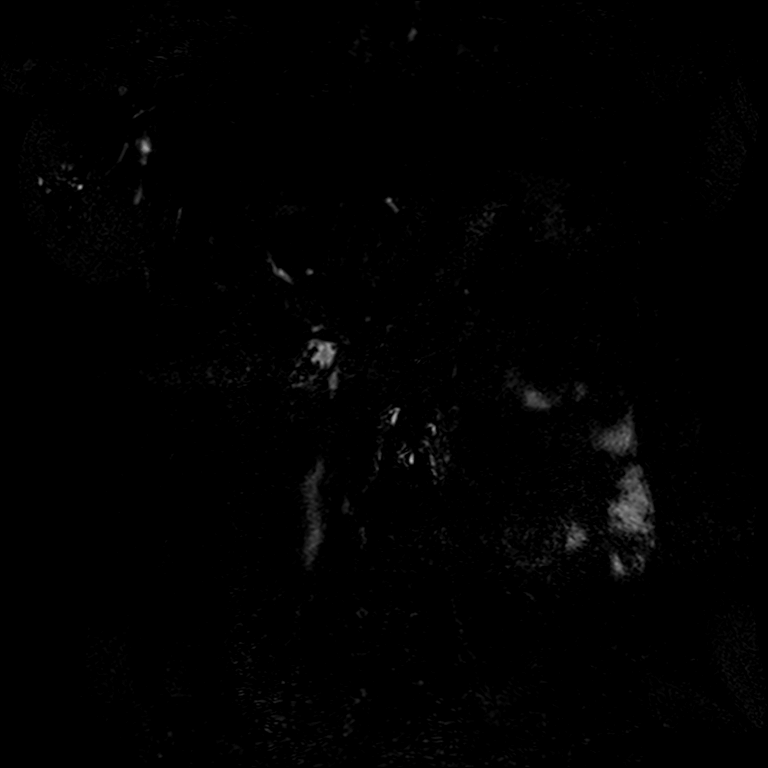
[im 66/80]
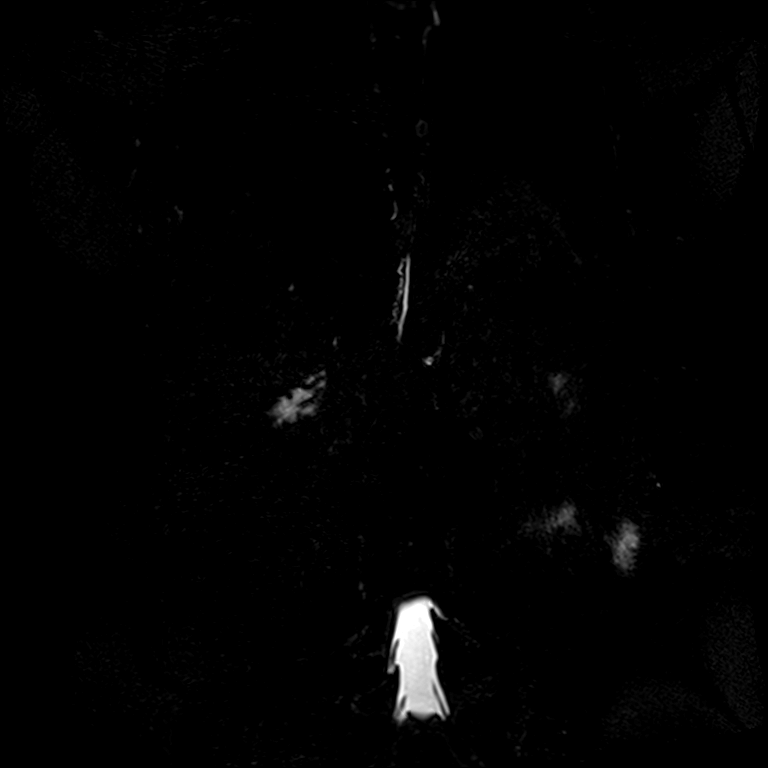
[im 80/80]
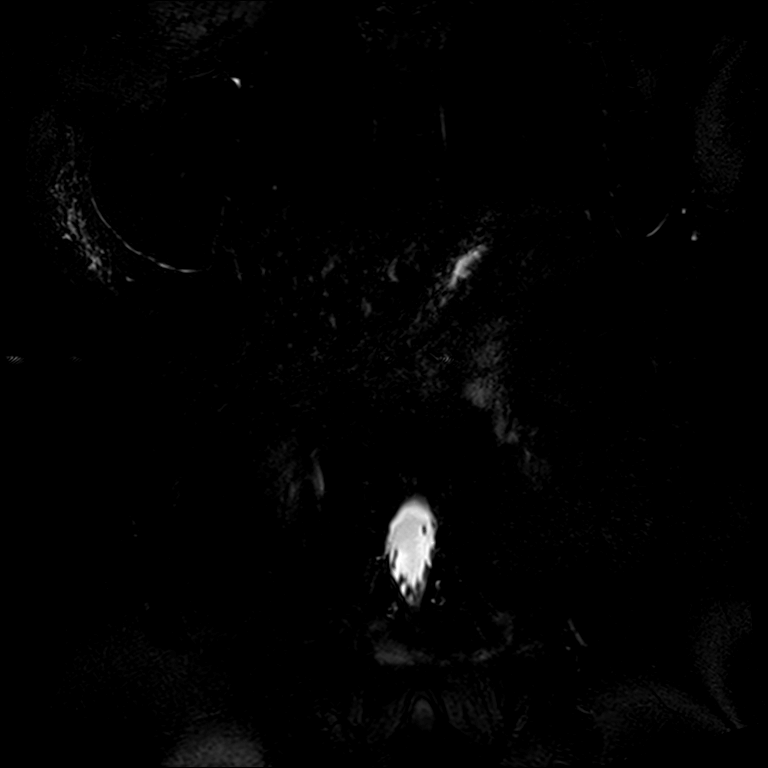

[Series 8: DWI · axial · 6.0mm · 1.49mm/px · z∈[-201,+65]mm · 6 of 76 slices shown (1 of 2)]
[im 1/76]
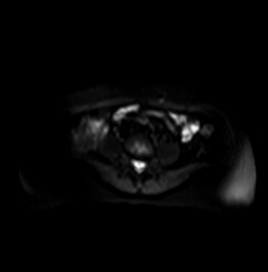
[im 16/76]
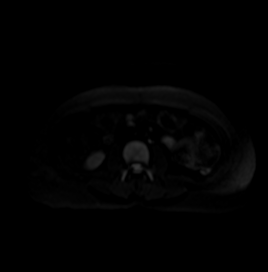
[im 31/76]
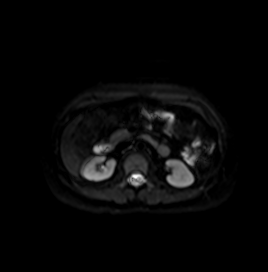
[im 46/76]
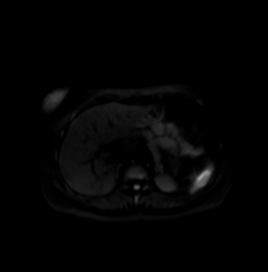
[im 61/76]
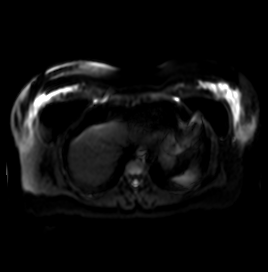
[im 76/76]
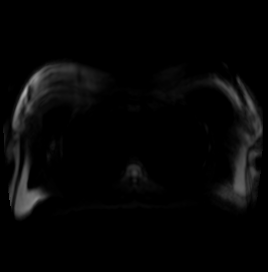

[Series 9: DWI · axial · 6.0mm · 1.49mm/px · z∈[-201,+65]mm · 3 of 38 slices shown (2 of 2)]
[im 1/38]
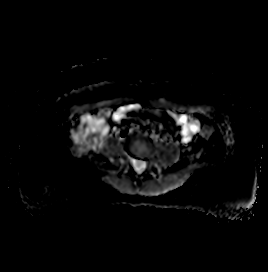
[im 19/38]
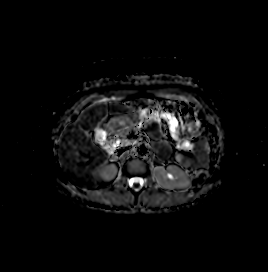
[im 38/38]
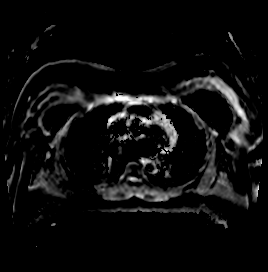

[20 of 48 positions shown; findings below may reference images not displayed]

FINDINGS: Lower chest: No acute findings.

Hepatobiliary: Within the limitations of unenhanced technique there
is no suspicious liver. Cyst within the periphery of the right
hepatic lobe measures 5 mm, image [DATE]. Cholecystectomy. No signs of
biliary dilatation. No choledocholithiasis.

Pancreas: No mass, inflammatory changes, or other parenchymal
abnormality identified.

Spleen:  Within normal limits in size and appearance.

Adrenals/Urinary Tract: Normal appearance of the adrenal glands. No
kidney mass or hydronephrosis identified.

Stomach/Bowel: Stomach appears normal.  No dilated loops of bowel.

Vascular/Lymphatic: No pathologically enlarged lymph nodes
identified. No abdominal aortic aneurysm demonstrated.

Other:  No free fluid or fluid collections.

Musculoskeletal: No suspicious bone lesions identified.
IMPRESSION: 1. No acute findings and no explanation for patient's pain.
2. Cholecystectomy. No signs of biliary dilatation or
choledocholithiasis.

## 2020-12-24 IMAGING — MR MR 3D RECON AT [facility]
4 of 11 series · 20 of 48 positions shown · non-contrast
Comparison: CT AP [DATE]

CLINICAL DATA: Nausea.  Biliary colic.

EXAM:
MRI ABDOMEN WITHOUT CONTRAST  (INCLUDING MRCP)
TECHNIQUE: Multiplanar multisequence MR imaging of the abdomen was performed.
Heavily T2-weighted images of the biliary and pancreatic ducts were
obtained, and three-dimensional MRCP images were rendered by post
processing.

[Series 3: T2 fat-sat · axial · 6.0mm · 1.19mm/px · z∈[-195,+57]mm · 4 of 36 slices shown]
[im 1/36]
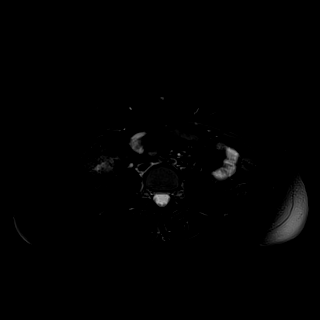
[im 12/36]
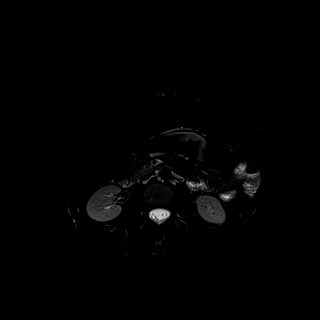
[im 24/36]
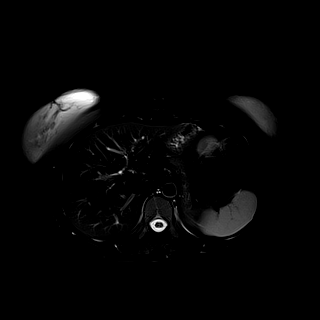
[im 36/36]
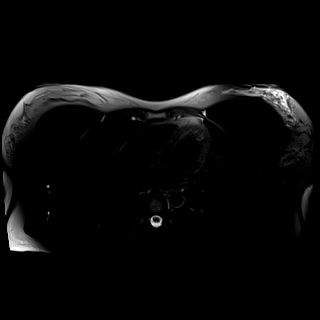

[Series 6: cor_3d_spc_trig · coronal · 1.0mm · 0.49mm/px · 7 of 80 slices shown]
[im 1/80]
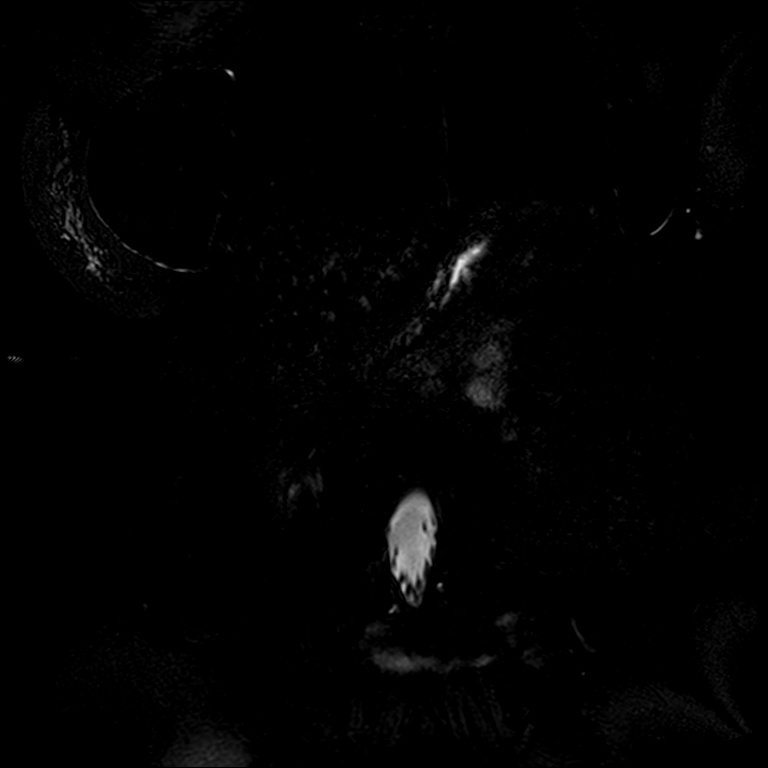
[im 14/80]
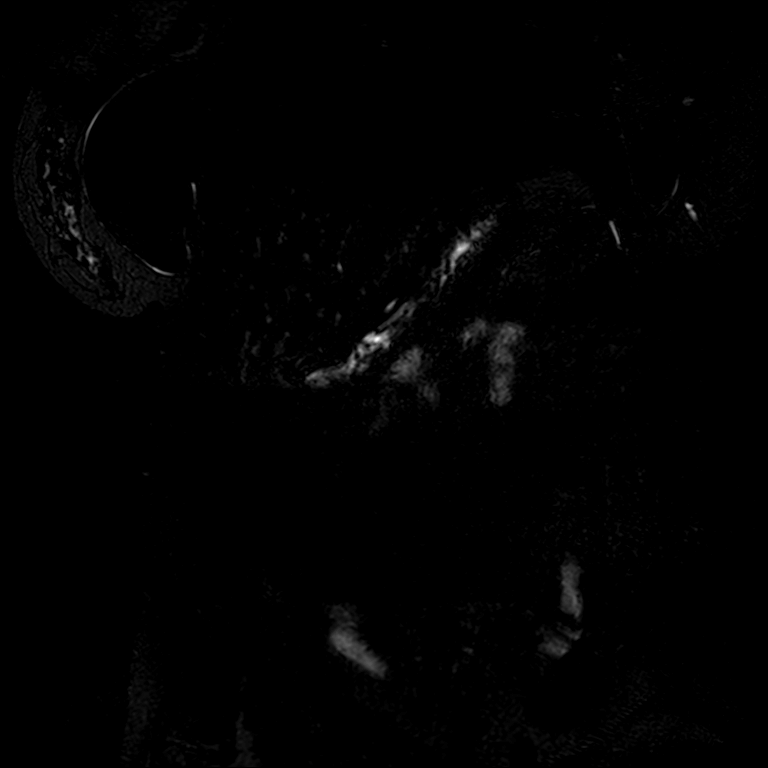
[im 27/80]
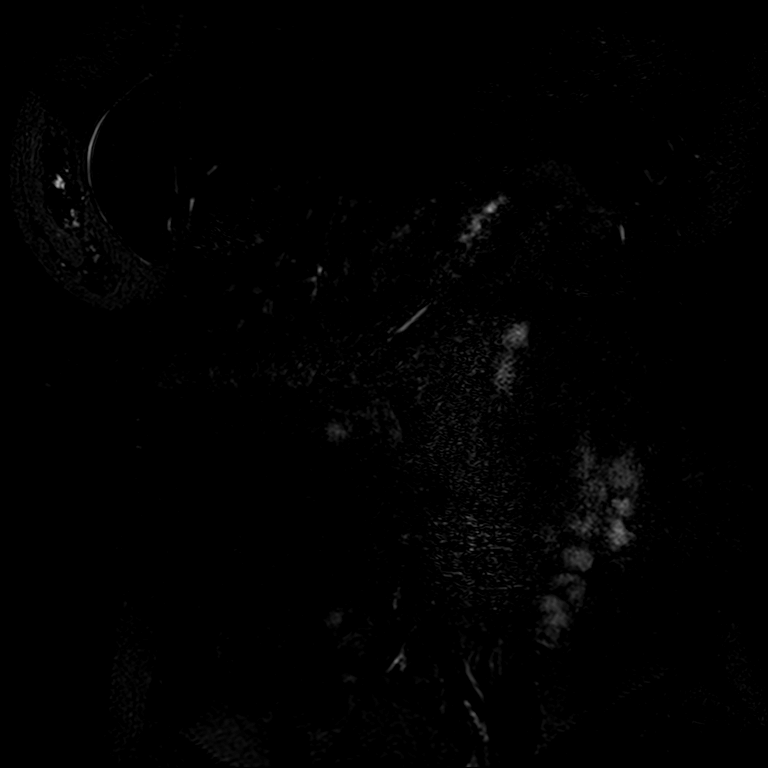
[im 40/80]
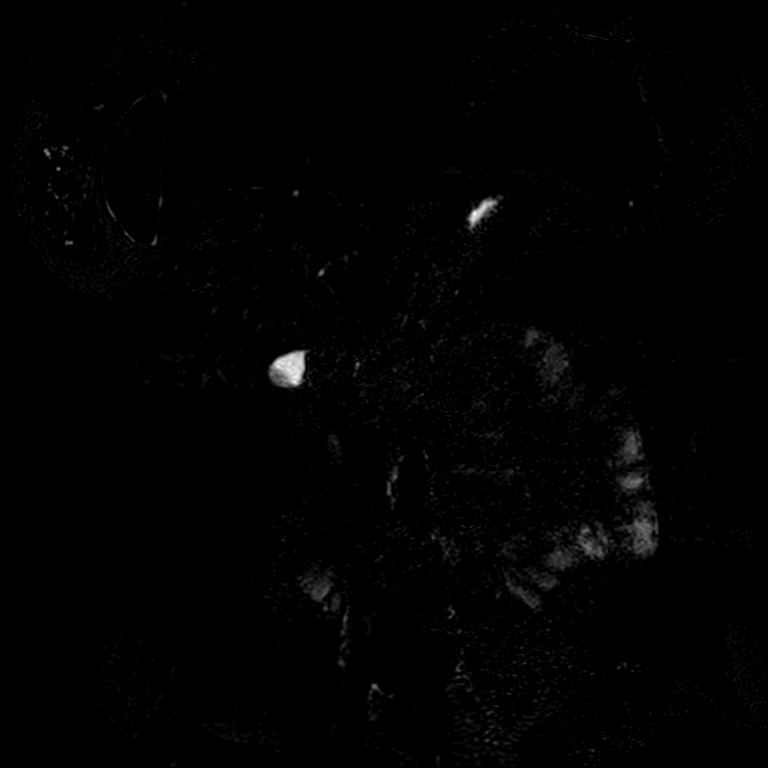
[im 53/80]
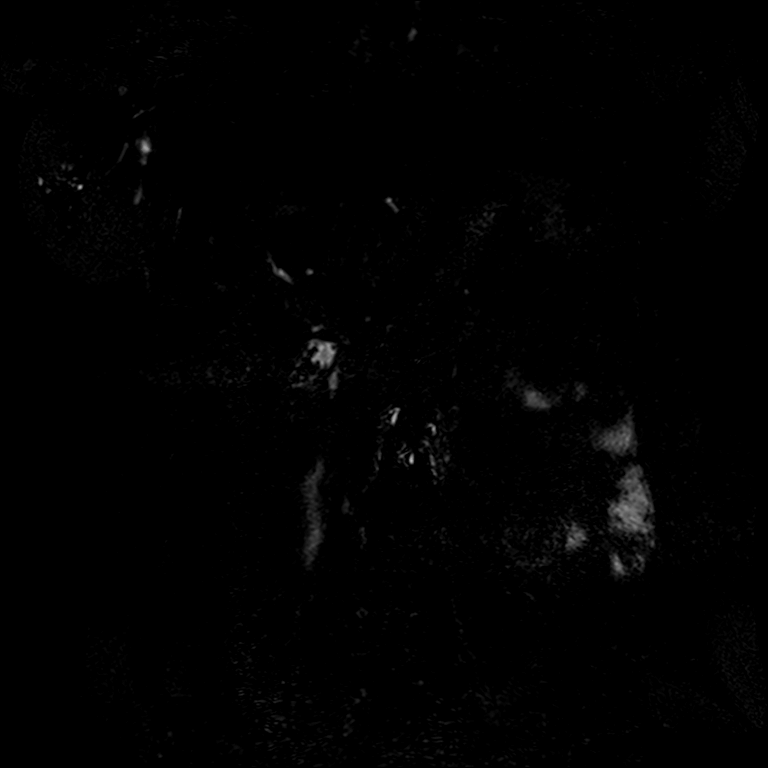
[im 66/80]
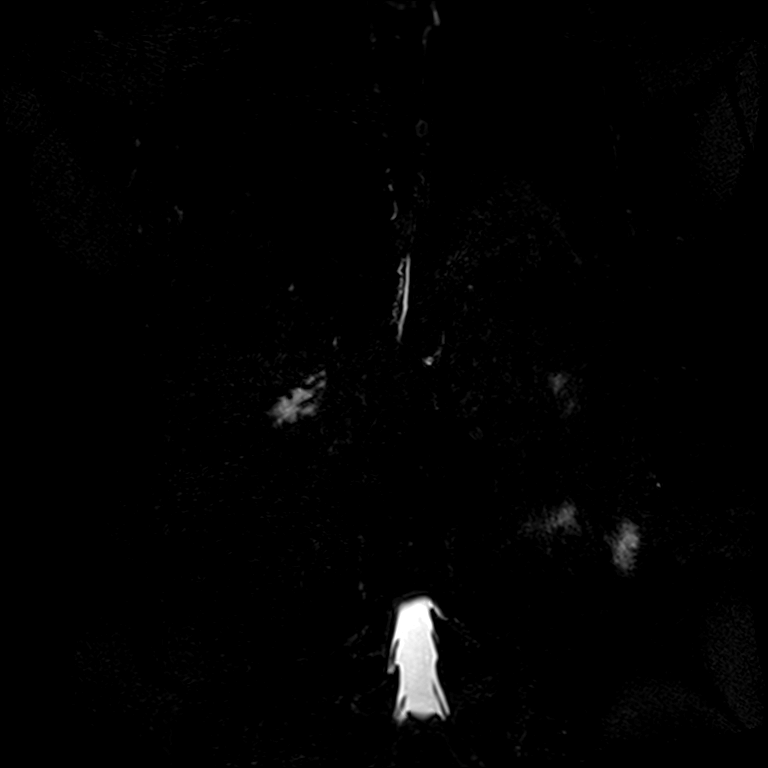
[im 80/80]
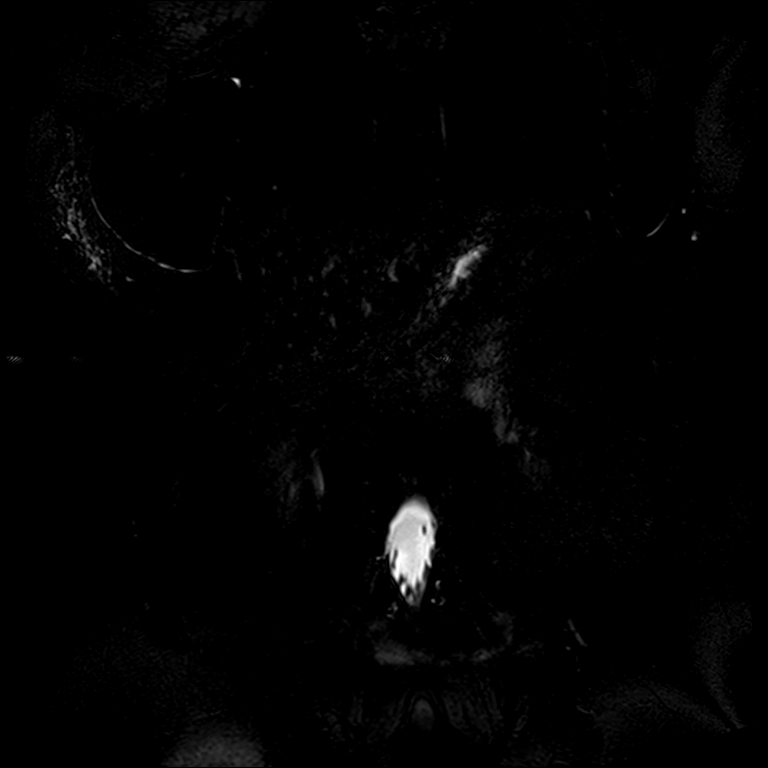

[Series 8: DWI · axial · 6.0mm · 1.49mm/px · z∈[-201,+65]mm · 6 of 76 slices shown (1 of 2)]
[im 1/76]
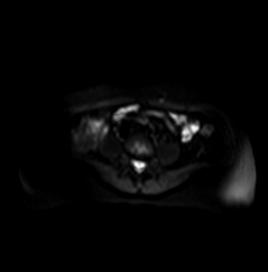
[im 16/76]
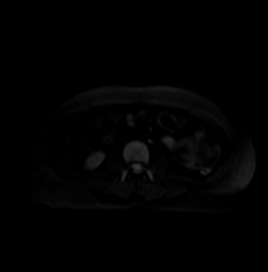
[im 31/76]
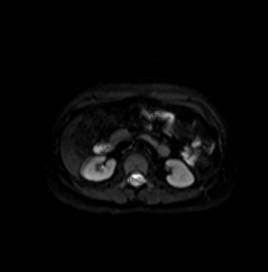
[im 46/76]
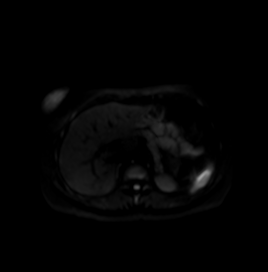
[im 61/76]
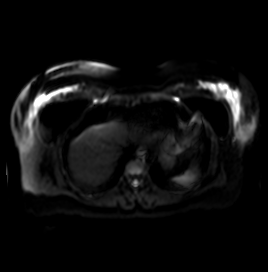
[im 76/76]
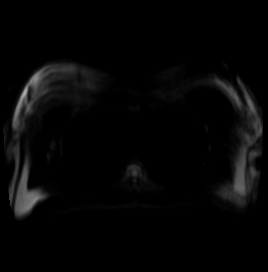

[Series 9: DWI · axial · 6.0mm · 1.49mm/px · z∈[-201,+65]mm · 3 of 38 slices shown (2 of 2)]
[im 1/38]
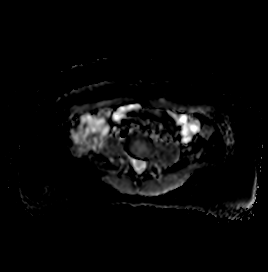
[im 19/38]
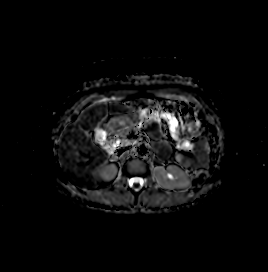
[im 38/38]
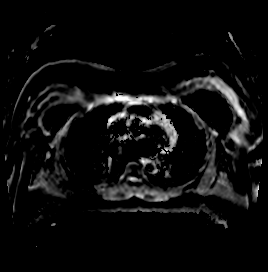

[20 of 48 positions shown; findings below may reference images not displayed]

FINDINGS: Lower chest: No acute findings.

Hepatobiliary: Within the limitations of unenhanced technique there
is no suspicious liver. Cyst within the periphery of the right
hepatic lobe measures 5 mm, image [DATE]. Cholecystectomy. No signs of
biliary dilatation. No choledocholithiasis.

Pancreas: No mass, inflammatory changes, or other parenchymal
abnormality identified.

Spleen:  Within normal limits in size and appearance.

Adrenals/Urinary Tract: Normal appearance of the adrenal glands. No
kidney mass or hydronephrosis identified.

Stomach/Bowel: Stomach appears normal.  No dilated loops of bowel.

Vascular/Lymphatic: No pathologically enlarged lymph nodes
identified. No abdominal aortic aneurysm demonstrated.

Other:  No free fluid or fluid collections.

Musculoskeletal: No suspicious bone lesions identified.
IMPRESSION: 1. No acute findings and no explanation for patient's pain.
2. Cholecystectomy. No signs of biliary dilatation or
choledocholithiasis.

## 2020-12-27 ENCOUNTER — Telehealth: Payer: Self-pay | Admitting: Internal Medicine

## 2020-12-27 NOTE — Telephone Encounter (Signed)
Pt is requesting a call back from a nurse to discuss her abdominal pain that has been transitioning to her back, pt is not able to lay down nor eat much, pt would like some advice on what she can do.

## 2020-12-27 NOTE — Telephone Encounter (Signed)
Spoke to patient who was seen in office on 12/20/20 for left upper quad pain that radiates to her back and between her shoulder blades.Patient called this morning with the same complaints. Her recent labs and imaging were normal, she is taking her medications as prescribed. She was advised that If her abdominal MRI/MRCP are negative will consider repeat EGD +/- colonoscopy. Please advise if she can proceed with procedure's

## 2020-12-27 NOTE — Telephone Encounter (Signed)
Dr. Carlean Purl, refer to office visit 12/20/2020. See phone note from patient. I'm not sure she needs a repeat EGD? Colonoscopy with TA 41mm in 2018, so repeat colonoscopy may be appropriate. Labs and abd MRI/MRCP were unrevealing. Do you want to see patient in clinic or do you advise endoscopic evaluation? Thank you for your input.   EGD 09/29/2019 by Dr. Carlean Purl: - Erythematous mucosa in the prepyloric region of the stomach.  Biopsies showed mild chronic gastritis, no evidence of H. pylori. - The examination was otherwise normal. - Biopsies were taken with a cold forceps for histology in the gastric body and in the entire Duodenum.  Biopsies without evidence of celiac disease.   Colonoscopy 05/10/2017 by Dr. Paulita Fujita: A 19mm tubular adenomatous polyp was found in the ascending colon.  The polyp was sessile.  The polyp was removed with a hot snare.  Resection and retrieval were complete. Internal hemorrhoids were found during retroflexion.  The hemorrhoids were mild. -5 year recall colonoscopy

## 2020-12-28 NOTE — Telephone Encounter (Signed)
Agree with scheduling EGD and colonoscopy for left upper quadrant pain and epigastric pain and abnormal CT gastrointestinal tract

## 2020-12-28 NOTE — Telephone Encounter (Signed)
Kelly, pls contact the patient and schedule her for an EGD and colonoscopy with Dr. Carlean Purl. Thank you.

## 2020-12-29 NOTE — Telephone Encounter (Signed)
Spoke to patient to schedule EGD/colonoscopy for ULQ pain. All questions answered. Patient voiced understanding.

## 2021-01-03 ENCOUNTER — Other Ambulatory Visit: Payer: Self-pay | Admitting: Internal Medicine

## 2021-01-11 ENCOUNTER — Other Ambulatory Visit: Payer: Self-pay | Admitting: Nurse Practitioner

## 2021-01-19 ENCOUNTER — Other Ambulatory Visit: Payer: Self-pay | Admitting: Nurse Practitioner

## 2021-01-25 ENCOUNTER — Encounter: Payer: Self-pay | Admitting: Internal Medicine

## 2021-01-25 ENCOUNTER — Ambulatory Visit (INDEPENDENT_AMBULATORY_CARE_PROVIDER_SITE_OTHER): Payer: BC Managed Care – PPO | Admitting: Psychiatry

## 2021-01-25 ENCOUNTER — Encounter: Payer: Self-pay | Admitting: Psychiatry

## 2021-01-25 ENCOUNTER — Ambulatory Visit (AMBULATORY_SURGERY_CENTER): Payer: BC Managed Care – PPO | Admitting: *Deleted

## 2021-01-25 ENCOUNTER — Other Ambulatory Visit: Payer: Self-pay

## 2021-01-25 VITALS — Wt 147.0 lb

## 2021-01-25 VITALS — Ht 64.0 in | Wt 147.0 lb

## 2021-01-25 DIAGNOSIS — Z860100 Personal history of colon polyps, unspecified: Secondary | ICD-10-CM

## 2021-01-25 DIAGNOSIS — F3281 Premenstrual dysphoric disorder: Secondary | ICD-10-CM | POA: Diagnosis not present

## 2021-01-25 DIAGNOSIS — R109 Unspecified abdominal pain: Secondary | ICD-10-CM
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
## 2021-01-25 NOTE — Progress Notes (Addendum)
No egg or soy allergy known to patient  No issues with past sedation with any surgeries or procedures No intubation problems in the past  No FH of Malignant Hyperthermia No diet pills per patient No home 02 use per patient  No blood thinners per patient  Pt denies issues with constipation  No A fib or A flutter  EMMI video to pt or via [place] 19 guidelines implemented in PV today with Pt and RN  Pt is fully vaccinated  for Covid   Patient requested instructions sent through MyChart  Due to the COVID-19 pandemic we are asking patients to follow certain guidelines.  Pt aware of COVID protocols and [facility] guidelines

## 2021-01-28 ENCOUNTER — Other Ambulatory Visit: Payer: Self-pay | Admitting: Nurse Practitioner

## 2021-01-29 ENCOUNTER — Other Ambulatory Visit: Payer: Self-pay | Admitting: Psychiatry

## 2021-01-29 DIAGNOSIS — F341 Dysthymic disorder: Secondary | ICD-10-CM

## 2021-01-29 DIAGNOSIS — F411 Generalized anxiety disorder: Secondary | ICD-10-CM

## 2021-01-29 DIAGNOSIS — F3281 Premenstrual dysphoric disorder: Secondary | ICD-10-CM

## 2021-02-02 ENCOUNTER — Other Ambulatory Visit: Payer: Self-pay | Admitting: Nurse Practitioner
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
## 2021-02-14 ENCOUNTER — Other Ambulatory Visit: Payer: Self-pay

## 2021-02-14 ENCOUNTER — Encounter: Payer: Self-pay | Admitting: Nurse Practitioner

## 2021-02-14 ENCOUNTER — Ambulatory Visit: Payer: BC Managed Care – PPO | Admitting: Nurse Practitioner

## 2021-02-14 VITALS — BP 110/70 | HR 72 | Resp 16 | Wt 150.0 lb

## 2021-02-14 DIAGNOSIS — B373 Candidiasis of vulva and vagina: Secondary | ICD-10-CM

## 2021-02-14 DIAGNOSIS — B3731 Acute candidiasis of vulva and vagina: Secondary | ICD-10-CM

## 2021-02-14 DIAGNOSIS — R102 Pelvic and perineal pain: Secondary | ICD-10-CM | POA: Diagnosis not present

## 2021-02-14 DIAGNOSIS — N812 Incomplete uterovaginal prolapse: Secondary | ICD-10-CM

## 2021-02-14 LAB — WET PREP FOR TRICH, YEAST, CLUE

## 2021-02-14 MED ORDER — FLUCONAZOLE 150 MG PO TABS: 150.0000 mg | ORAL_TABLET | Freq: Once | ORAL | 1 refills | Status: AC

## 2021-02-14 NOTE — Patient Instructions (Addendum)
Vaginal Yeast Infection, Adult  Vaginal yeast infection is a condition that causes vaginal discharge as well as soreness, swelling, and redness (inflammation) of the vagina. This is a common condition. Some women get this infection frequently. What are the causes? This condition is caused by a change in the normal balance of the yeast (candida) and bacteria that live in the vagina. This change causes an overgrowth of yeast, which causes the inflammation. What increases the risk? The condition is more likely to develop in women who:  Take antibiotic medicines.  Have diabetes.  Take birth control pills.  Are pregnant.  Douche often.  Have a weak body defense system (immune system).  Have been taking steroid medicines for a long time.  Frequently wear tight clothing. What are the signs or symptoms? Symptoms of this condition include:  White, thick, creamy vaginal discharge.  Swelling, itching, redness, and irritation of the vagina. The lips of the vagina (vulva) may be affected as well.  Pain or a burning feeling while urinating.  Pain during sex. How is this diagnosed? This condition is diagnosed based on:  Your medical history.  A physical exam.  A pelvic exam. Your health care provider will examine a sample of your vaginal discharge under a microscope. Your health care provider may send this sample for testing to confirm the diagnosis. How is this treated? This condition is treated with medicine. Medicines may be over-the-counter or prescription. You may be told to use one or more of the following:  Medicine that is taken by mouth (orally).  Medicine that is applied as a cream (topically).  Medicine that is inserted directly into the vagina (suppository). Follow these instructions at home: Lifestyle  Do not have sex until your health care provider approves. Tell your sex partner that you have a yeast infection. That person should go to his or her health care  provider and ask if they should also be treated.  Do not wear tight clothes, such as pantyhose or tight pants.  Wear breathable cotton underwear. General instructions  Take or apply over-the-counter and prescription medicines only as told by your health care provider.  Eat more yogurt. This may help to keep your yeast infection from returning.  Do not use tampons until your health care provider approves.  Try taking a sitz bath to help with discomfort. This is a warm water bath that is taken while you are sitting down. The water should only come up to your hips and should cover your buttocks. Do this 3-4 times per day or as told by your health care provider.  Do not douche.  If you have diabetes, keep your blood sugar levels under control.  Keep all follow-up visits as told by your health care provider. This is important.   Contact a health care provider if:  You have a fever.  Your symptoms go away and then return.  Your symptoms do not get better with treatment.  Your symptoms get worse.  You have new symptoms.  You develop blisters in or around your vagina.  You have blood coming from your vagina and it is not your menstrual period.  You develop pain in your abdomen. Summary  Vaginal yeast infection is a condition that causes discharge as well as soreness, swelling, and redness (inflammation) of the vagina.  This condition is treated with medicine. Medicines may be over-the-counter or prescription.  Take or apply over-the-counter and prescription medicines only as told by your health care provider.  Do not   douche. Do not have sex or use tampons until your health care provider approves.  Contact a health care provider if your symptoms do not get better with treatment or your symptoms go away and then return. This information is not intended to replace advice given to you by your health care provider. Make sure you discuss any questions you have with your health care  provider. Document Revised: 06/06/2019 Document Reviewed: 03/25/2018 Elsevier Patient Education  2021 [place].    Pelvic Organ Prolapse Pelvic organ prolapse is a condition in women that involves the stretching, bulging, or dropping of pelvic organs into an abnormal position, past the opening of the vagina. It happens when the muscles and tissues that surround and support pelvic structures become weak or stretched. Pelvic organ prolapse can involve the:  Vagina (vaginal prolapse).  Uterus (uterine prolapse).  Bladder (cystocele).  Rectum (rectocele).  Intestines (enterocele). When organs other than the vagina are involved, they often bulge into the vagina or protrude from the vagina, depending on how severe the prolapse is. What are the causes? This condition may be caused by:  Pregnancy, labor, and childbirth.  Past pelvic surgery.  Lower levels of the hormone estrogen due to menopause.  Consistently lifting more than 50 lb (23 kg).  Obesity.  Long-term difficulty passing stool (chronic constipation).  Long-term, or chronic, cough.  Fluid buildup in the abdomen due to certain conditions. What are the signs or symptoms? Symptoms of this condition include:  Leaking a little urine (loss of bladder control) when you cough, sneeze, strain, and exercise (stress incontinence). This may be worse immediately after childbirth. It may gradually improve over time.  Feeling pressure in your pelvis or vagina. This pressure may increase when you cough or when you are passing stool.  A bulge that protrudes from the opening of your vagina.  Difficulty passing urine or stool.  Pain in your lower back.  Pain or discomfort during sex, or decreased interest in sex.  Repeated bladder infections (urinary tract infections).  Difficulty inserting a tampon. In some people, this condition causes no symptoms. How is this diagnosed? This condition may be diagnosed based on a  vaginal and rectal exam. During the exam, you may be asked to cough and strain while you are lying down, sitting, and standing up. Your health care provider will determine if other tests are required, such as bladder function tests. How is this treated? Treatment for this condition may depend on your symptoms. Treatment may include:  Lifestyle changes, such as drinking plenty of fluids and eating foods that are high in fiber.  Emptying your bladder at scheduled times (bladder training therapy). This can help reduce or avoid urinary incontinence.  Estrogen. This may help mild prolapse by increasing the strength and tone of pelvic floor muscles.  Kegel exercises. These may help mild cases of prolapse by strengthening and tightening the muscles of the pelvic floor.  A soft, flexible device that helps support the vaginal walls and keep pelvic organs in place (pessary). This is inserted into your vagina by your health care provider.  Surgery. This is often the only form of treatment for severe prolapse. Follow these instructions at home: Eating and drinking  Avoid drinking beverages that contain caffeine or alcohol.  Increase your intake of high-fiber foods to decrease constipation and straining during bowel movements. Activity  Lose weight if recommended by your health care provider.  Avoid heavy lifting and straining with exercise and work. Do not hold your breath  when you perform mild to moderate lifting and exercise activities. Limit your activities as directed by your health care provider.  Do Kegel exercises as directed by your health care provider. To do this: ? Squeeze your pelvic floor muscles tight. You should feel a tight lift in your rectal area and a tightness in your vaginal area. Keep your stomach, buttocks, and legs relaxed. ? Hold the muscles tight for up to 10 seconds. Then relax your muscles. ? Repeat this exercise 50 times a day, or as much as told by your health care  provider. Continue to do this exercise for at least 4-6 weeks, or for as long as told by your health care provider. General instructions  Take over-the-counter and prescription medicines only as told by your health care provider.  Wear a sanitary pad or adult diapers if you have urinary incontinence.  If you have a pessary, take care of it as told by your health care provider.  Keep all follow-up visits. This is important. Contact a health care provider if you:  Have symptoms that interfere with your daily activities or sex life.  Need medicine to help with the discomfort.  Notice bleeding from your vagina that is not related to your menstrual period.  Have a fever.  Have pain or bleeding when you urinate.  Have bleeding when you pass stool.  Pass urine when you have sex.  Have chronic constipation.  Have a pessary that falls out.  Have a foul-smelling vaginal discharge.  Have an unusual, low pain in your abdomen. Get help right away if you:  Cannot pass urine. Summary  Pelvic organ prolapse is the stretching, bulging, or dropping of pelvic organs into an abnormal position. It happens when the muscles and tissues that surround and support pelvic structures become weak or stretched.  When organs other than the vagina are involved, they often bulge into the vagina or protrude from it, depending on how severe the prolapse is.  In most cases, this condition needs to be treated only if it produces symptoms. Treatment may include lifestyle changes, estrogen, Kegel exercises, pessary insertion, or surgery.  Avoid heavy lifting and straining with exercise and work. Do not hold your breath when you perform mild to moderate lifting and exercise activities. Limit your activities as directed by your health care provider. This information is not intended to replace advice given to you by your health care provider. Make sure you discuss any questions you have with your health care  provider. Document Revised: 05/03/2020 Document Reviewed: 05/03/2020 Elsevier Patient Education  [place].

## 2021-02-17 ENCOUNTER — Encounter: Payer: Self-pay | Admitting: Certified Registered Nurse Anesthetist

## 2021-02-18 ENCOUNTER — Encounter: Payer: Self-pay | Admitting: Internal Medicine

## 2021-02-18 ENCOUNTER — Ambulatory Visit (AMBULATORY_SURGERY_CENTER): Payer: BC Managed Care – PPO | Admitting: Internal Medicine

## 2021-02-18 ENCOUNTER — Other Ambulatory Visit: Payer: Self-pay

## 2021-02-18 VITALS — BP 102/76 | HR 65 | Temp 98.0°F | Resp 20 | Ht 64.0 in | Wt 147.0 lb

## 2021-02-18 DIAGNOSIS — Z86010 Personal history of colonic polyps: Secondary | ICD-10-CM

## 2021-02-18 DIAGNOSIS — K2970 Gastritis, unspecified, without bleeding: Secondary | ICD-10-CM

## 2021-02-18 DIAGNOSIS — R1013 Epigastric pain: Secondary | ICD-10-CM

## 2021-02-18 DIAGNOSIS — R110 Nausea: Secondary | ICD-10-CM

## 2021-02-18 DIAGNOSIS — K2950 Unspecified chronic gastritis without bleeding: Secondary | ICD-10-CM | POA: Diagnosis not present

## 2021-02-18 DIAGNOSIS — R109 Unspecified abdominal pain: Secondary | ICD-10-CM

## 2021-02-18 MED ORDER — SODIUM CHLORIDE 0.9 % IV SOLN: 500.0000 mL | Freq: Once | INTRAVENOUS | Status: DC

## 2021-02-18 NOTE — Op Note (Addendum)
[facility] Patient Name: Sara Bishop Procedure Date: 02/18/2021 3:08 PM MRN: [id number] Endoscopist: Gatha Mayer , MD Age: 45 Referring MD:  Date of Birth: 1976/02/24 Gender: Female Account #: [id number] Procedure:                Upper GI endoscopy Indications:              Epigastric abdominal pain, Abdominal pain in the                            right upper quadrant Medicines:                Propofol per Anesthesia, Monitored Anesthesia Care Procedure:                Pre-Anesthesia Assessment:                           - Prior to the procedure, a History and Physical                            was performed, and patient medications and                            allergies were reviewed. The patient's tolerance of                            previous anesthesia was also reviewed. The risks                            and benefits of the procedure and the sedation                            options and risks were discussed with the patient.                            All questions were answered, and informed consent                            was obtained. Prior Anticoagulants: The patient has                            taken no previous anticoagulant or antiplatelet                            agents. ASA Grade Assessment: II - A patient with                            mild systemic disease. After reviewing the risks                            and benefits, the patient was deemed in                            satisfactory condition to undergo the procedure.  After obtaining informed consent, the endoscope was                            passed under direct vision. Throughout the                            procedure, the patient's blood pressure, pulse, and                            oxygen saturations were monitored continuously. The                            Endoscope was introduced through the mouth, and                            advanced to  the second part of duodenum. The upper                            GI endoscopy was accomplished without difficulty.                            The patient tolerated the procedure well. Scope In: Scope Out: Findings:                 Patchy mild inflammation characterized by erosions,                            erythema and friability was found in the prepyloric                            region of the stomach. Biopsies were taken with a                            cold forceps for histology. Verification of patient                            identification for the specimen was done. Estimated                            blood loss was minimal.                           The exam was otherwise without abnormality.                           The cardia and gastric fundus were normal on                            retroflexion.                           Biopsies were taken with a cold forceps in the                            entire duodenum for  histology. Verification of                            patient identification for the specimen was done.                            Estimated blood loss was minimal. Complications:            No immediate complications. Estimated Blood Loss:     Estimated blood loss was minimal. Impression:               - Gastritis. Biopsied.                           - The examination was otherwise normal.                           - Biopsies were taken with a cold forceps for                            histology in the entire duodenum. Recommendation:           - Patient has a contact number available for                            emergencies. The signs and symptoms of potential                            delayed complications were discussed with the                            patient. Return to normal activities tomorrow.                            Written discharge instructions were provided to the                            patient.                           -  Resume previous diet.                           - Await pathology results. Has failed PPI's before.                            Also may need rx for constipation                           - See the other procedure note for documentation of                            additional recommendations. Gatha Mayer, MD 02/18/2021 3:48:03 PM This report has been signed electronically.

## 2021-02-18 NOTE — Progress Notes (Signed)
[facility]   Pt's states no medical or surgical changes since previsit or office visit.  

## 2021-02-18 NOTE — Op Note (Signed)
[facility] Patient Name: Sara Bishop Procedure Date: 02/18/2021 3:08 PM MRN: [id number] Endoscopist: Gatha Mayer , MD Age: 45 Referring MD:  Date of Birth: [birth year] Gender: Female Account #: [id number] Procedure:                Colonoscopy Indications:              Surveillance: Personal history of adenomatous                            polyps on last colonoscopy > 3 years ago Medicines:                Propofol per Anesthesia, Monitored Anesthesia Care Procedure:                Pre-Anesthesia Assessment:                           - Prior to the procedure, a History and Physical                            was performed, and patient medications and                            allergies were reviewed. The patient's tolerance of                            previous anesthesia was also reviewed. The risks                            and benefits of the procedure and the sedation                            options and risks were discussed with the patient.                            All questions were answered, and informed consent                            was obtained. Prior Anticoagulants: The patient has                            taken no previous anticoagulant or antiplatelet                            agents. ASA Grade Assessment: II - A patient with                            mild systemic disease. After reviewing the risks                            and benefits, the patient was deemed in                            satisfactory condition to undergo the procedure.  After obtaining informed consent, the colonoscope                            was passed under direct vision. Throughout the                            procedure, the patient's blood pressure, pulse, and                            oxygen saturations were monitored continuously. The                            Olympus PCF-H190DL (#[id number]) Colonoscope was                             introduced through the anus and advanced to the the                            terminal ileum, with identification of the                            appendiceal orifice and IC valve. The colonoscopy                            was performed without difficulty. The patient                            tolerated the procedure well. The quality of the                            bowel preparation was excellent. The bowel                            preparation used was Miralax via split dose                            instruction. The terminal ileum, ileocecal valve,                            appendiceal orifice, and rectum were photographed. Scope In: 3:28:55 PM Scope Out: 3:41:07 PM Scope Withdrawal Time: 0 hours 9 minutes 15 seconds  Total Procedure Duration: 0 hours 12 minutes 12 seconds  Findings:                 The perianal and digital rectal examinations were                            normal.                           The terminal ileum appeared normal.                           The entire examined colon appeared normal on direct  and retroflexion views. Complications:            No immediate complications. Estimated Blood Loss:     Estimated blood loss: none. Impression:               - The examined portion of the ileum was normal.                           - The entire examined colon is normal on direct and                            retroflexion views.                           - No specimens collected.                           - Personal history of colonic polyp 6 mm adenoma                            2018. Recommendation:           - Patient has a contact number available for                            emergencies. The signs and symptoms of potential                            delayed complications were discussed with the                            patient. Return to normal activities tomorrow.                            Written discharge instructions  were provided to the                            patient.                           - Resume previous diet.                           - Continue present medications.                           - See the other procedure note for documentation of                            additional recommendations.                           - Repeat colonoscopy in 10 years for surveillance. Gatha Mayer, MD 02/18/2021 3:52:33 PM This report has been signed electronically.

## 2021-02-18 NOTE — Progress Notes (Signed)
Report given to PACU, vss 

## 2021-02-18 NOTE — Progress Notes (Signed)
1513 Robinul 0.1 mg IV given due large amount of secretions upon assessment.  MD made aware, vss

## 2021-02-18 NOTE — Progress Notes (Signed)
Called to room to assist during endoscopic procedure.  Patient ID and intended procedure confirmed with present staff. Received instructions for my participation in the procedure from the performing physician.  

## 2021-02-18 NOTE — Patient Instructions (Addendum)
I saw inflammation in the stomach and took biopsies to evaluate that and also from duodenum 9upper intestine).  The colonoscopy ws normal.  I will contact you with results and plans.  I appreciate the opportunity to care for you. Gatha Mayer, MD, [facility]  Gastritis handout given to patient.  Resume previous diet. Continue present medications.  Repeat colonoscopy in 10 years for surveillance.  YOU HAD AN ENDOSCOPIC PROCEDURE TODAY AT [facility]:   Refer to the procedure report that was given to you for any specific questions about what was found during the examination.  If the procedure report does not answer your questions, please call your gastroenterologist to clarify.  If you requested that your care partner not be given the details of your procedure findings, then the procedure report has been included in a sealed envelope for you to review at your convenience later.  YOU SHOULD EXPECT: Some feelings of bloating in the abdomen. Passage of more gas than usual.  Walking can help get rid of the air that was put into your GI tract during the procedure and reduce the bloating. If you had a lower endoscopy (such as a colonoscopy or flexible sigmoidoscopy) you may notice spotting of blood in your stool or on the toilet paper. If you underwent a bowel prep for your procedure, you may not have a normal bowel movement for a few days.  Please Note:  You might notice some irritation and congestion in your nose or some drainage.  This is from the oxygen used during your procedure.  There is no need for concern and it should clear up in a day or so.  SYMPTOMS TO REPORT IMMEDIATELY:   Following lower endoscopy (colonoscopy or flexible sigmoidoscopy):  Excessive amounts of blood in the stool  Significant tenderness or worsening of abdominal pains  Swelling of the abdomen that is new, acute  Fever of 100F or higher   Following upper endoscopy (EGD)  Vomiting of blood or coffee  ground material  New chest pain or pain under the shoulder blades  Painful or persistently difficult swallowing  New shortness of breath  Fever of 100F or higher  Black, tarry-looking stools  For urgent or emergent issues, a gastroenterologist can be reached at any hour by calling [phone]. Do not use MyChart messaging for urgent concerns.    DIET:  We do recommend a small meal at first, but then you may proceed to your regular diet.  Drink plenty of fluids but you should avoid alcoholic beverages for 24 hours.  ACTIVITY:  You should plan to take it easy for the rest of today and you should NOT DRIVE or use heavy machinery until tomorrow (because of the sedation medicines used during the test).    FOLLOW UP: Our staff will call the number listed on your records 48-72 hours following your procedure to check on you and address any questions or concerns that you may have regarding the information given to you following your procedure. If we do not reach you, we will leave a message.  We will attempt to reach you two times.  During this call, we will ask if you have developed any symptoms of COVID 19. If you develop any symptoms (ie: fever, flu-like symptoms, shortness of breath, cough etc.) before then, please call [phone].  If you test positive for Covid 19 in the 2 weeks post procedure, please call and report this information to [place].    If any biopsies  were taken you will be contacted by phone or by letter within the next 1-3 weeks.  Please call us at [phone] if you have not heard about the biopsies in 3 weeks.    SIGNATURES/CONFIDENTIALITY: You and/or your care partner have signed paperwork which will be entered into your electronic medical record.  These signatures attest to the fact that that the information above on your After Visit Summary has been reviewed and is understood.  Full responsibility of the confidentiality of this discharge information lies with you and/or  your care-partner.

## 2021-02-22 ENCOUNTER — Telehealth: Payer: Self-pay

## 2021-02-22 NOTE — Telephone Encounter (Signed)
Second attempt follow up call to pt, lm on vm. 

## 2021-02-22 NOTE — Telephone Encounter (Signed)
NO ANSWER, MESSAGE LEFT FOR PATIENT. 

## 2021-03-06 ENCOUNTER — Other Ambulatory Visit: Payer: Self-pay | Admitting: Psychiatry

## 2021-03-06 DIAGNOSIS — F341 Dysthymic disorder: Secondary | ICD-10-CM

## 2021-03-06 DIAGNOSIS — F3281 Premenstrual dysphoric disorder: Secondary | ICD-10-CM

## 2021-03-06 DIAGNOSIS — F411 Generalized anxiety disorder: Secondary | ICD-10-CM

## 2021-03-07 ENCOUNTER — Other Ambulatory Visit: Payer: Self-pay | Admitting: Psychiatry

## 2021-03-07 DIAGNOSIS — F341 Dysthymic disorder: Secondary | ICD-10-CM

## 2021-03-07 DIAGNOSIS — F411 Generalized anxiety disorder: Secondary | ICD-10-CM

## 2021-03-07 DIAGNOSIS — F3281 Premenstrual dysphoric disorder: Secondary | ICD-10-CM

## 2021-03-08 ENCOUNTER — Ambulatory Visit: Payer: BC Managed Care – PPO | Admitting: Psychiatry

## 2021-03-15 ENCOUNTER — Ambulatory Visit: Payer: BC Managed Care – PPO | Admitting: Psychiatry

## 2021-04-15 ENCOUNTER — Telehealth: Payer: Self-pay | Admitting: Internal Medicine

## 2021-04-15 NOTE — Telephone Encounter (Signed)
The pt has questions regarding SIBO testing.  She has been advised to call the company at the number provided with any testing concerns. The pt has been advised of the information and verbalized understanding.

## 2021-04-22 ENCOUNTER — Encounter: Payer: Self-pay | Admitting: Internal Medicine

## 2021-04-22 ENCOUNTER — Ambulatory Visit: Payer: BC Managed Care – PPO | Admitting: Internal Medicine

## 2021-04-22 VITALS — BP 90/64 | HR 68 | Ht 63.75 in | Wt 153.0 lb

## 2021-04-22 DIAGNOSIS — G4489 Other headache syndrome: Secondary | ICD-10-CM

## 2021-04-22 DIAGNOSIS — K581 Irritable bowel syndrome with constipation: Secondary | ICD-10-CM

## 2021-04-22 DIAGNOSIS — T781XXA Other adverse food reactions, not elsewhere classified, initial encounter: Secondary | ICD-10-CM

## 2021-04-22 DIAGNOSIS — K30 Functional dyspepsia: Secondary | ICD-10-CM

## 2021-04-22 NOTE — Patient Instructions (Signed)
We will contact you with SIBO breath test results.  It comes to me by email - I am away next week so might not hear until week after.  Check out these websites   [email or web address]   [email or web address]   [email or web address]   I appreciate the opportunity to care for you. Gatha Mayer, MD, [facility]
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
## 2021-06-14 NOTE — Telephone Encounter (Signed)
Dr. Carlean Purl do you want to repeat breath test or treat her?

## 2021-07-01 ENCOUNTER — Ambulatory Visit: Payer: BC Managed Care – PPO

## 2021-07-01 ENCOUNTER — Ambulatory Visit: Payer: BC Managed Care – PPO | Admitting: Obstetrics & Gynecology

## 2021-07-28 ENCOUNTER — Ambulatory Visit: Payer: BC Managed Care – PPO | Admitting: Psychiatry

## 2021-08-03 ENCOUNTER — Other Ambulatory Visit: Payer: Self-pay

## 2021-08-03 ENCOUNTER — Encounter: Payer: Self-pay | Admitting: Obstetrics and Gynecology

## 2021-08-03 ENCOUNTER — Ambulatory Visit: Payer: BC Managed Care – PPO | Admitting: Obstetrics and Gynecology

## 2021-08-03 VITALS — BP 118/60 | HR 88 | Ht 63.75 in | Wt 153.0 lb

## 2021-08-03 DIAGNOSIS — R102 Pelvic and perineal pain unspecified side: Secondary | ICD-10-CM

## 2021-08-03 DIAGNOSIS — H1045 Other chronic allergic conjunctivitis: Secondary | ICD-10-CM | POA: Insufficient documentation

## 2021-08-03 DIAGNOSIS — J309 Allergic rhinitis, unspecified: Secondary | ICD-10-CM | POA: Insufficient documentation

## 2021-08-03 DIAGNOSIS — J3081 Allergic rhinitis due to animal (cat) (dog) hair and dander: Secondary | ICD-10-CM | POA: Insufficient documentation

## 2021-08-03 DIAGNOSIS — K219 Gastro-esophageal reflux disease without esophagitis: Secondary | ICD-10-CM | POA: Insufficient documentation

## 2021-08-03 DIAGNOSIS — J301 Allergic rhinitis due to pollen: Secondary | ICD-10-CM | POA: Insufficient documentation

## 2021-08-03 DIAGNOSIS — M6289 Other specified disorders of muscle: Secondary | ICD-10-CM

## 2021-08-03 DIAGNOSIS — N949 Unspecified condition associated with female genital organs and menstrual cycle: Secondary | ICD-10-CM | POA: Diagnosis not present

## 2021-08-03 DIAGNOSIS — N9489 Other specified conditions associated with female genital organs and menstrual cycle: Secondary | ICD-10-CM

## 2021-08-03 DIAGNOSIS — J4530 Mild persistent asthma, uncomplicated: Secondary | ICD-10-CM | POA: Insufficient documentation

## 2021-08-03 DIAGNOSIS — N926 Irregular menstruation, unspecified: Secondary | ICD-10-CM

## 2021-08-03 LAB — WET PREP FOR TRICH, YEAST, CLUE

## 2021-08-03 LAB — PREGNANCY, URINE: Preg Test, Ur: NEGATIVE
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
## 2021-08-18 ENCOUNTER — Ambulatory Visit: Payer: BC Managed Care – PPO | Admitting: Psychiatry

## 2021-08-18 ENCOUNTER — Other Ambulatory Visit: Payer: Self-pay

## 2021-08-18 ENCOUNTER — Encounter: Payer: Self-pay | Admitting: Psychiatry

## 2021-08-18 VITALS — Wt 145.0 lb

## 2021-08-18 DIAGNOSIS — F3281 Premenstrual dysphoric disorder: Secondary | ICD-10-CM | POA: Diagnosis not present
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
## 2021-09-12 ENCOUNTER — Other Ambulatory Visit: Payer: Self-pay

## 2021-09-12 MED ORDER — HYOSCYAMINE SULFATE 0.125 MG PO TBDP: 0.1250 mg | ORAL_TABLET | ORAL | 3 refills | Status: DC | PRN

## 2021-09-12 MED ORDER — HYOSCYAMINE SULFATE 0.125 MG SL SUBL: 0.1250 mg | SUBLINGUAL_TABLET | SUBLINGUAL | 3 refills | Status: DC | PRN

## 2021-09-12 NOTE — Telephone Encounter (Signed)
Pt requesting a refill for the Hyoscyamine Sulfate (HYOSCYAMINE PO) Please advise on Dosage and Refills Thanks

## 2021-10-20 ENCOUNTER — Encounter: Payer: Self-pay | Admitting: Obstetrics and Gynecology

## 2021-10-20 ENCOUNTER — Other Ambulatory Visit: Payer: Self-pay

## 2021-10-20 ENCOUNTER — Ambulatory Visit (INDEPENDENT_AMBULATORY_CARE_PROVIDER_SITE_OTHER): Payer: BC Managed Care – PPO | Admitting: Obstetrics and Gynecology

## 2021-10-20 VITALS — BP 122/84 | HR 76 | Ht 64.0 in | Wt 151.0 lb

## 2021-10-20 DIAGNOSIS — R102 Pelvic and perineal pain: Secondary | ICD-10-CM | POA: Diagnosis not present

## 2021-10-20 DIAGNOSIS — R42 Dizziness and giddiness: Secondary | ICD-10-CM

## 2021-10-20 DIAGNOSIS — N9089 Other specified noninflammatory disorders of vulva and perineum: Secondary | ICD-10-CM
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
## 2022-02-08 ENCOUNTER — Telehealth: Payer: Self-pay | Admitting: *Deleted

## 2022-02-08 NOTE — Telephone Encounter (Signed)
Left message for patient to call regarding pelvic floor referral from [facility] on 07/2021. I wanted to see if patient still wanted to proceed with referral. ?

## 2022-02-13 NOTE — Telephone Encounter (Signed)
Patient called back in voice mail. I returned her call but received her voice mail. I left message that Sara Bishop called to inquire if she would still like referral for pelvic floor PT. I asked her to call us back and okay to leave message with reply if needed. ?

## 2022-02-16 ENCOUNTER — Ambulatory Visit (INDEPENDENT_AMBULATORY_CARE_PROVIDER_SITE_OTHER): Payer: BC Managed Care – PPO | Admitting: Obstetrics and Gynecology

## 2022-02-16 ENCOUNTER — Encounter: Payer: Self-pay | Admitting: Obstetrics and Gynecology

## 2022-02-16 VITALS — BP 110/64 | HR 66 | Ht 64.0 in | Wt 151.0 lb

## 2022-02-16 DIAGNOSIS — R4586 Emotional lability: Secondary | ICD-10-CM

## 2022-02-16 DIAGNOSIS — N760 Acute vaginitis: Secondary | ICD-10-CM | POA: Diagnosis not present

## 2022-02-16 LAB — WET PREP FOR TRICH, YEAST, CLUE
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
## 2022-02-17 LAB — SURESWAB® ADVANCED VAGINITIS,TMA
CANDIDA SPECIES: NOT DETECTED
Candida glabrata: NOT DETECTED
SURESWAB(R) ADV BACTERIAL VAGINOSIS(BV),TMA: NEGATIVE
TRICHOMONAS VAGINALIS (TV),TMA: NOT DETECTED

## 2022-02-21 NOTE — Telephone Encounter (Signed)
Patient never called back to discuss referral. Encounter closed.  ?
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
## 2022-04-06 ENCOUNTER — Encounter: Payer: Self-pay | Admitting: Obstetrics and Gynecology

## 2022-04-06 ENCOUNTER — Ambulatory Visit (INDEPENDENT_AMBULATORY_CARE_PROVIDER_SITE_OTHER): Payer: BC Managed Care – PPO | Admitting: Obstetrics and Gynecology

## 2022-04-06 VITALS — BP 104/62 | HR 76 | Ht 64.0 in | Wt 155.0 lb

## 2022-04-06 DIAGNOSIS — Z01419 Encounter for gynecological examination (general) (routine) without abnormal findings: Secondary | ICD-10-CM | POA: Diagnosis not present

## 2022-04-06 NOTE — Patient Instructions (Signed)
Perimenopause Perimenopause is the normal time of a woman's life when the levels of estrogen, the female hormone produced by the ovaries, begin to decrease. This leads to changes in menstrual periods before they stop completely (menopause). Perimenopause can begin 2-8 years before menopause. During perimenopause, the ovaries may or may not produce an egg and a woman can still become pregnant. What are the causes? This condition is caused by a natural change in hormone levels that happens as you get older. What increases the risk? This condition is more likely to start at an earlier age if you have certain medical conditions or have undergone treatments, including: A tumor of the pituitary gland in the brain. A disease that affects the ovaries and hormone production. Certain cancer treatments, such as chemotherapy or hormone therapy, or radiation therapy on the pelvis. Heavy smoking and excessive alcohol use. Family history of early menopause. What are the signs or symptoms? Perimenopausal changes affect each woman differently. Symptoms of this condition may include: Hot flashes. Irregular menstrual periods. Night sweats. Changes in feelings about sex. This could be a decrease in sex drive or an increased discomfort around your sexuality. Vaginal dryness. Headaches. Mood swings. Depression. Problems sleeping (insomnia). Memory problems or trouble concentrating. Irritability. Tiredness. Weight gain. Anxiety. Trouble getting pregnant. How is this diagnosed? This condition is diagnosed based on your medical history, a physical exam, your age, your menstrual history, and your symptoms. Hormone tests may also be done. How is this treated? In some cases, no treatment is needed. You and your health care provider should make a decision together about whether treatment is necessary. Treatment will be based on your individual condition and preferences. Various treatments are available, such  as: Menopausal hormone therapy (MHT). Medicines to treat specific symptoms. Acupuncture. Vitamin or herbal supplements. Before starting treatment, make sure to let your health care provider know if you have a personal or family history of: Heart disease. Breast cancer. Blood clots. Diabetes. Osteoporosis. Follow these instructions at home: Medicines Take over-the-counter and prescription medicines only as told by your health care provider. Take vitamin supplements only as told by your health care provider. Talk with your health care provider before starting any herbal supplements. Lifestyle  Do not use any products that contain nicotine or tobacco, such as cigarettes, e-cigarettes, and chewing tobacco. If you need help quitting, ask your health care provider. Get at least 30 minutes of physical activity on 5 or more days each week. Eat a balanced diet that includes fresh fruits and vegetables, whole grains, soybeans, eggs, lean meat, and low-fat dairy. Avoid alcoholic and caffeinated beverages, as well as spicy foods. This may help prevent hot flashes. Get 7-8 hours of sleep each night. Dress in layers that can be removed to help you manage hot flashes. Find ways to manage stress, such as deep breathing, meditation, or journaling. General instructions  Keep track of your menstrual periods, including: When they occur. How heavy they are and how long they last. How much time passes between periods. Keep track of your symptoms, noting when they start, how often you have them, and how long they last. Use vaginal lubricants or moisturizers to help with vaginal dryness and improve comfort during sex. You can still become pregnant if you are having irregular periods. Make sure you use contraception during perimenopause if you do not want to get pregnant. Keep all follow-up visits. This is important. This includes any group therapy or counseling. Contact a health care provider if: You  have   heavy vaginal bleeding or pass blood clots. Your period lasts more than 2 days longer than normal. Your periods are recurring sooner than 21 days. You bleed after having sex. You have pain during sex. Get help right away if you have: Chest pain, trouble breathing, or trouble talking. Severe depression. Pain when you urinate. Severe headaches. Vision problems. Summary Perimenopause is the time when a woman's body begins to move into menopause. This may happen naturally or as a result of other health problems or medical treatments. Perimenopause can begin 2-8 years before menopause, and it can last for several years. Perimenopausal symptoms can be managed through medicines, lifestyle changes, and complementary therapies such as acupuncture. This information is not intended to replace advice given to you by your health care provider. Make sure you discuss any questions you have with your health care provider. Document Revised: 04/22/2020 Document Reviewed: 04/22/2020 Elsevier Patient Education  2023 [employer]  

## 2022-04-19 ENCOUNTER — Ambulatory Visit: Payer: BC Managed Care – PPO | Admitting: Obstetrics and Gynecology

## 2022-04-19 ENCOUNTER — Encounter: Payer: Self-pay | Admitting: Obstetrics and Gynecology

## 2022-04-19 VITALS — BP 110/64 | HR 65 | Ht 64.0 in | Wt 157.0 lb

## 2022-04-19 DIAGNOSIS — Z3201 Encounter for pregnancy test, result positive: Secondary | ICD-10-CM

## 2022-04-19 DIAGNOSIS — O09521 Supervision of elderly multigravida, first trimester: Secondary | ICD-10-CM

## 2022-04-19 DIAGNOSIS — N926 Irregular menstruation, unspecified: Secondary | ICD-10-CM | POA: Diagnosis not present

## 2022-04-19 DIAGNOSIS — Z8619 Personal history of other infectious and parasitic diseases: Secondary | ICD-10-CM

## 2022-04-19 LAB — PREGNANCY, URINE: Preg Test, Ur: POSITIVE — AB
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
## 2022-04-19 NOTE — Patient Instructions (Signed)
Pregnancy After Age 47 Women who become pregnant after the age of 21 have a higher risk for certain problems during pregnancy. This is because older women may already have health problems before becoming pregnant. Older women who are healthy before pregnancy may still develop problems during pregnancy. These problems may affect the mother, the unborn baby (fetus), or both. How does this affect me? If you are over age 85 and you want to become pregnant or are pregnant, you may have a higher risk of: Not being able to get pregnant (infertility). Going into labor early (preterm labor). Needing surgical delivery of your baby (cesarean delivery, or C-section). Having complications during pregnancy, such as high blood pressure and other symptoms (preeclampsia). Having diabetes during pregnancy (gestational diabetes). Being pregnant with more than one baby. Loss of the unborn baby before 20 weeks (miscarriage) or after 20 weeks of pregnancy (stillbirth). How does this affect my baby? Babies born to women over the age of 60 have a higher risk for: Being born early (prematurity). Low birth weight, which is less than 5 lb, 8 oz (2.5 kg). Birth defects, such as Down syndrome and cleft palate. Health complications, including problems with growth and development. Prenatal care All women should see their health care provider before they try to become pregnant. This is especially important for women over the age of 69. Tell your health care provider about: Any health problems you have. Any medicines you take. Any family history of health problems or chromosome-related defects. Any problems you have had with past surgeries, pregnancies, or deliveries. If you are over age 24 and you plan to become pregnant, start taking a daily multivitamin a month or more before you try to get pregnant. Your multivitamin should contain 400 mcg (micrograms) of folic acid. If you are over age 20 and pregnant, make sure  you: Keep taking your multivitamin unless your health care provider tells you not to take it. Keep all follow-up visits. This includes prenatal visits. This is important. Talk with your health care provider about other prenatal screening tests that you may need. Prenatal tests Screening tests show whether your baby has a higher risk for birth defects than other babies. Screening tests include: More frequent ultrasound tests to look for markers that indicate a risk for birth defects. Maternal blood screening. These are blood tests that help to determine your baby's risk for birth defects. Screening tests do not show whether your baby has or does not have birth defects. They only show your baby's risk for certain birth defects. If your screening tests show that risk factors are present, you may need tests to confirm the birth defect (diagnostic testing). These tests may include: Chorionic villus sampling. For this procedure, a tissue sample is taken from the organ that forms in your uterus to nourish your baby (placenta). The sample is removed through your cervix or abdomen and tested. Amniocentesis. For this procedure, a small amount of the fluid that surrounds the baby in the uterus (amniotic fluid) is removed and tested. You will also need screenings tests for gestational diabetes in the first trimester, as well as later in pregnancy. Staying healthy during pregnancy Staying healthy during pregnancy can help you and your baby to have a lower risk for problems during pregnancy, during delivery, or both. Talk with your health care provider for specific instructions about staying healthy during your pregnancy. General tips Nutrition  At each meal, eat a variety of foods from each of the five food groups. These  groups include: Proteins such as lean meats, poultry, fish that is low in fat, beans, eggs, and nuts. Vegetables such as leafy greens, raw and cooked vegetables, and vegetable juice. Fruits  that are fresh, frozen, or canned, or 100% fruit juice. Dairy products such as low-fat yogurt, cheese, and milk. Whole grains including rice, cereal, pasta, and bread. Follow instructions from your health care provider about eating and drinking restrictions during pregnancy. Do not eat raw eggs, raw meat, or raw fish or seafood. Do not eat any fish that contains high amounts of mercury, such as swordfish or mackerel. Managing weight gain Ask your health care provider how much weight gain is healthy during pregnancy. Stay at a healthy weight. If needed, work with your health care provider to lose weight safely. Exercise regularly, as directed by your health care provider. Ask your health care provider what forms of exercise are safe for you. Follow these instructions at home: Do not use any products that contain nicotine or tobacco. These products include cigarettes, chewing tobacco, and vaping devices, such as e-cigarettes. If you need help quitting, ask your health care provider. Drink enough fluid to keep your urine pale yellow. Do not drink alcohol, use drugs, or abuse prescription medicine. Take over-the-counter and prescription medicines only as told by your health care provider. Do not use hot tubs, steam rooms, or saunas. Talk with your health care provider about your risk of exposure to harmful environmental conditions. This includes exposure to chemicals, radiation, cleaning products, and cat feces. Follow advice from your health care provider about how to limit your exposure. Summary Women who become pregnant after the age of 40 have a higher risk for complications during pregnancy. Problems may affect the mother, the unborn baby (fetus), or both. All women should see their health care provider before they try to become pregnant. This is especially important for women over the age of 53. Staying healthy during pregnancy can help both you and your baby to have a lower risk for some of  the problems that can happen during pregnancy, during delivery, or both. This information is not intended to replace advice given to you by your health care provider. Make sure you discuss any questions you have with your health care provider. Document Revised: 07/26/2020 Document Reviewed: 07/26/2020 Elsevier Patient Education  [place].

## 2022-04-20 ENCOUNTER — Telehealth: Payer: Self-pay

## 2022-04-20 ENCOUNTER — Other Ambulatory Visit: Payer: Self-pay

## 2022-04-20 DIAGNOSIS — Z3201 Encounter for pregnancy test, result positive: Secondary | ICD-10-CM

## 2022-04-20 DIAGNOSIS — N926 Irregular menstruation, unspecified: Secondary | ICD-10-CM

## 2022-04-20 LAB — HCG, QUANTITATIVE, PREGNANCY: HCG, Total, QN: 1589 m[IU]/mL

## 2022-04-20 NOTE — Telephone Encounter (Signed)
-----   Message from Sara Dom, MD sent at 04/20/2022 12:22 PM EDT ----- Please let the patient know that her BhcG isn't quiet high enough. Have her come in tomorrow for a stat BhcG so that we know her level prior to the weekend.

## 2022-04-20 NOTE — Telephone Encounter (Signed)
I spoke with patient and informed her. Lab appt scheduled for tomorrow. Looks like order is in already . I will confirm with lab.  Patient had questions and would like me to reply to her in My Chart instead of calling her back.  #1 What does this number mean?  #2 How great is the risk of miscarriage based on this result?  #3  What result is Dr. Talbert Nan looking for it to be.  I did explain that Dr. Talbert Nan will need another result to be able to compare results and see how things are progressing.

## 2022-04-21 ENCOUNTER — Other Ambulatory Visit: Payer: BC Managed Care – PPO

## 2022-04-21 ENCOUNTER — Other Ambulatory Visit: Payer: Self-pay

## 2022-04-21 DIAGNOSIS — N926 Irregular menstruation, unspecified: Secondary | ICD-10-CM

## 2022-04-21 DIAGNOSIS — Z3201 Encounter for pregnancy test, result positive: Secondary | ICD-10-CM

## 2022-04-21 LAB — HCG, QUANTITATIVE, PREGNANCY: HCG, Total, QN: 1570 m[IU]/mL

## 2022-04-24 ENCOUNTER — Other Ambulatory Visit: Payer: BC Managed Care – PPO

## 2022-04-24 DIAGNOSIS — N926 Irregular menstruation, unspecified: Secondary | ICD-10-CM

## 2022-04-24 LAB — HCG, QUANTITATIVE, PREGNANCY: HCG, Total, QN: 1464 m[IU]/mL

## 2022-04-25 ENCOUNTER — Telehealth: Payer: Self-pay | Admitting: Obstetrics and Gynecology

## 2022-04-25 ENCOUNTER — Other Ambulatory Visit: Payer: BC Managed Care – PPO

## 2022-04-25 ENCOUNTER — Encounter (HOSPITAL_COMMUNITY): Payer: Self-pay | Admitting: Obstetrics & Gynecology

## 2022-04-25 ENCOUNTER — Inpatient Hospital Stay (HOSPITAL_COMMUNITY)
Admission: AD | Admit: 2022-04-25 | Discharge: 2022-04-25 | Disposition: A | Discharge status: Home / Self Care | Payer: BC Managed Care – PPO | Attending: Obstetrics & Gynecology | Admitting: Obstetrics & Gynecology

## 2022-04-25 ENCOUNTER — Encounter: Payer: Self-pay | Admitting: Obstetrics and Gynecology

## 2022-04-25 ENCOUNTER — Inpatient Hospital Stay (HOSPITAL_COMMUNITY): Payer: BC Managed Care – PPO

## 2022-04-25 DIAGNOSIS — O09529 Supervision of elderly multigravida, unspecified trimester: Secondary | ICD-10-CM | POA: Diagnosis not present

## 2022-04-25 DIAGNOSIS — O26899 Other specified pregnancy related conditions, unspecified trimester: Secondary | ICD-10-CM | POA: Diagnosis present

## 2022-04-25 DIAGNOSIS — M545 Low back pain, unspecified: Secondary | ICD-10-CM | POA: Diagnosis not present

## 2022-04-25 DIAGNOSIS — O009 Unspecified ectopic pregnancy without intrauterine pregnancy: Secondary | ICD-10-CM | POA: Diagnosis not present

## 2022-04-25 DIAGNOSIS — Z3A00 Weeks of gestation of pregnancy not specified: Secondary | ICD-10-CM | POA: Insufficient documentation

## 2022-04-25 LAB — COMPREHENSIVE METABOLIC PANEL
ALT: 17 U/L (ref 0–44)
AST: 22 U/L (ref 15–41)
Albumin: 3.7 g/dL (ref 3.5–5.0)
Alkaline Phosphatase: 49 U/L (ref 38–126)
Anion gap: 9 (ref 5–15)
BUN: 9 mg/dL (ref 6–20)
CO2: 24 mmol/L (ref 22–32)
Calcium: 9 mg/dL (ref 8.9–10.3)
Chloride: 104 mmol/L (ref 98–111)
Creatinine, Ser: 0.84 mg/dL (ref 0.44–1.00)
GFR, Estimated: >60 mL/min (ref >60)
Glucose, Bld: 86 mg/dL (ref 70–99)
Potassium: 3.4 mmol/L — ABNORMAL LOW (ref 3.5–5.1)
Sodium: 137 mmol/L (ref 135–145)
Total Bilirubin: 0.4 mg/dL (ref 0.3–1.2)
Total Protein: 6.4 g/dL — ABNORMAL LOW (ref 6.5–8.1)

## 2022-04-25 LAB — URINALYSIS, ROUTINE W REFLEX MICROSCOPIC
Bilirubin Urine: NEGATIVE
Glucose, UA: NEGATIVE mg/dL
Ketones, ur: NEGATIVE mg/dL
Leukocytes,Ua: NEGATIVE
Nitrite: NEGATIVE
Protein, ur: NEGATIVE mg/dL
Specific Gravity, Urine: 1.005 (ref 1.005–1.030)
pH: 7 (ref 5.0–8.0)

## 2022-04-25 LAB — WET PREP, GENITAL
Clue Cells Wet Prep HPF POC: NONE SEEN
Sperm: NONE SEEN
Trich, Wet Prep: NONE SEEN
WBC, Wet Prep HPF POC: >=10 — AB (ref <10)
Yeast Wet Prep HPF POC: NONE SEEN

## 2022-04-25 LAB — CBC
HCT: 35.7 % — ABNORMAL LOW (ref 36.0–46.0)
Hemoglobin: 12.3 g/dL (ref 12.0–15.0)
MCH: 31.9 pg (ref 26.0–34.0)
MCHC: 34.5 g/dL (ref 30.0–36.0)
MCV: 92.5 fL (ref 80.0–100.0)
Platelets: 287 10*3/uL (ref 150–400)
RBC: 3.86 MIL/uL — ABNORMAL LOW (ref 3.87–5.11)
RDW: 13.8 % (ref 11.5–15.5)
WBC: 7.2 10*3/uL (ref 4.0–10.5)
nRBC: 0 % (ref 0.0–0.2)

## 2022-04-25 LAB — ABO/RH: ABO/RH(D): O POS

## 2022-04-25 LAB — HCG, QUANTITATIVE, PREGNANCY: hCG, Beta Chain, Quant, S: 1328 m[IU]/mL — ABNORMAL HIGH (ref <5)

## 2022-04-25 IMAGING — US US OB < 14 WEEKS - US OB TV
1 series · 15 of 28 positions shown · non-contrast
Comparison: None Available.

CLINICAL DATA: Pain.  Decreasing beta HCG

EXAM:
OBSTETRIC <14 WK US AND TRANSVAGINAL OB US
TECHNIQUE: Both transabdominal and transvaginal ultrasound examinations were
performed for complete evaluation of the gestation as well as the
maternal uterus, adnexal regions, and pelvic cul-de-sac.
Transvaginal technique was performed to assess early pregnancy.

[Series 1: us ob < 14 weeks - us ob tv · 100 acquisitions, 15 frames shown]
[im 1/100]
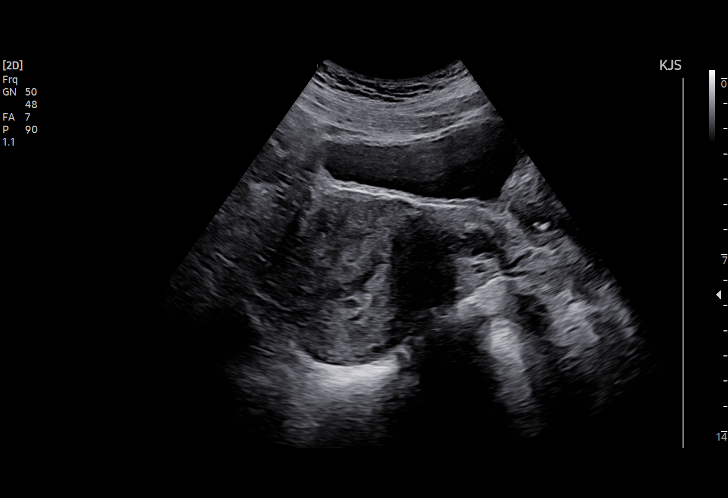
[im 8/100]
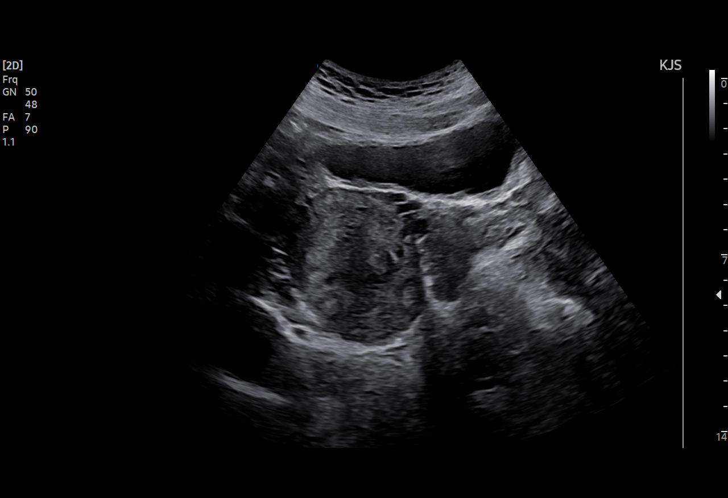
[im 15/100]
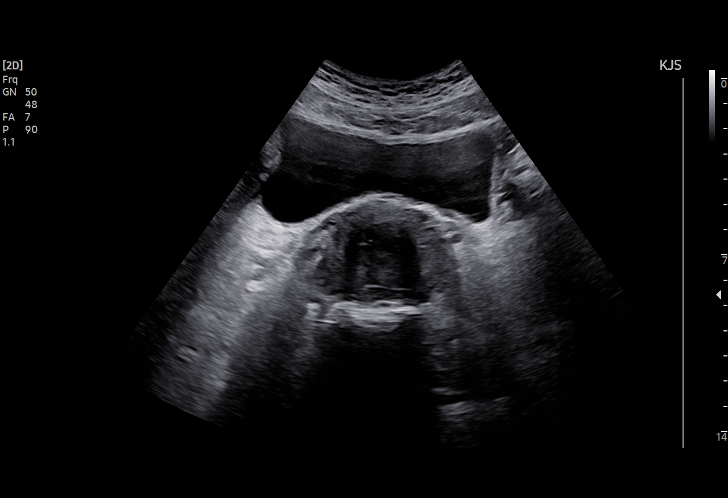
[im 23/100]
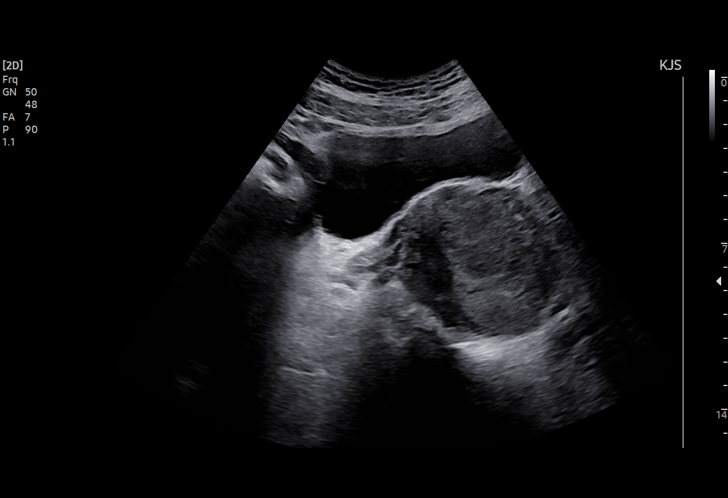
[im 30/100]
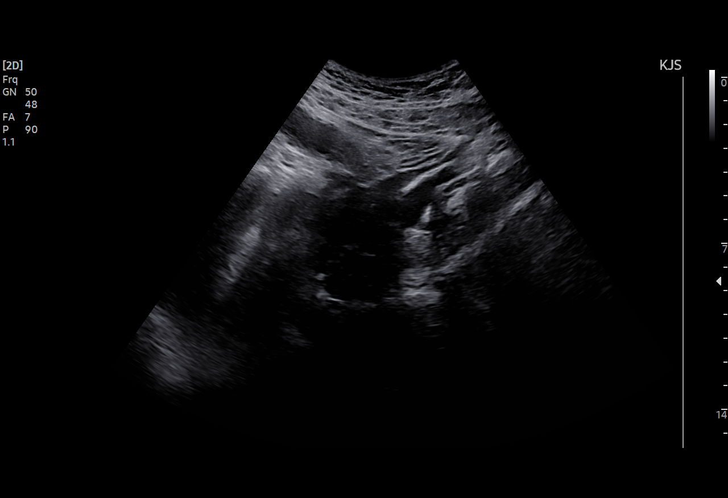
[im 37/100]
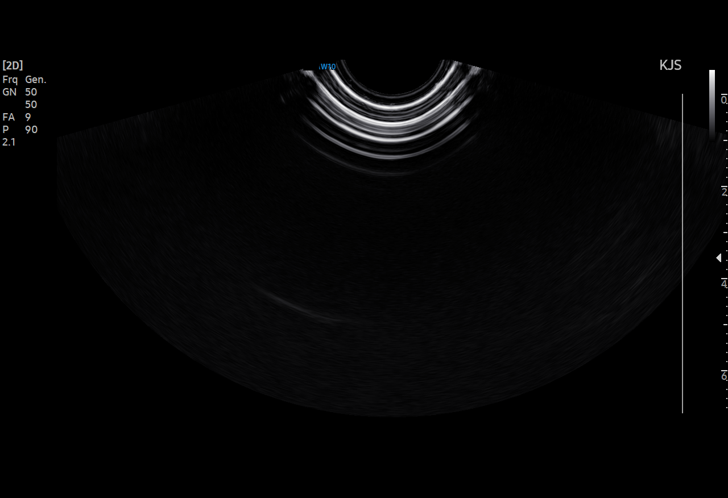
[im 45/100]
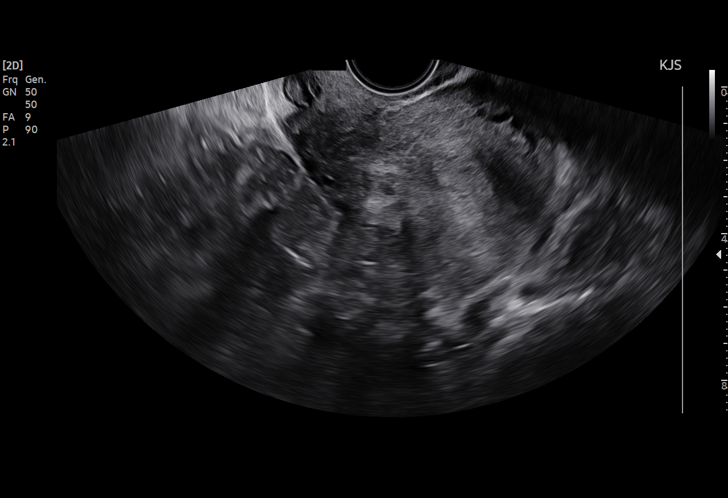
[im 52/100]
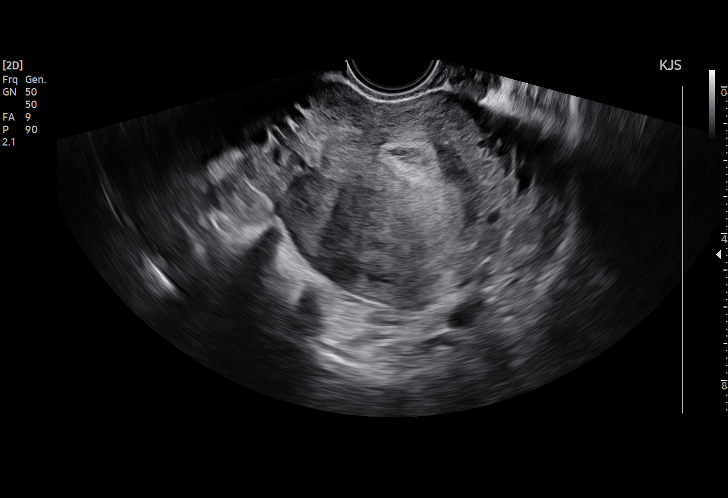
[im 56/100]
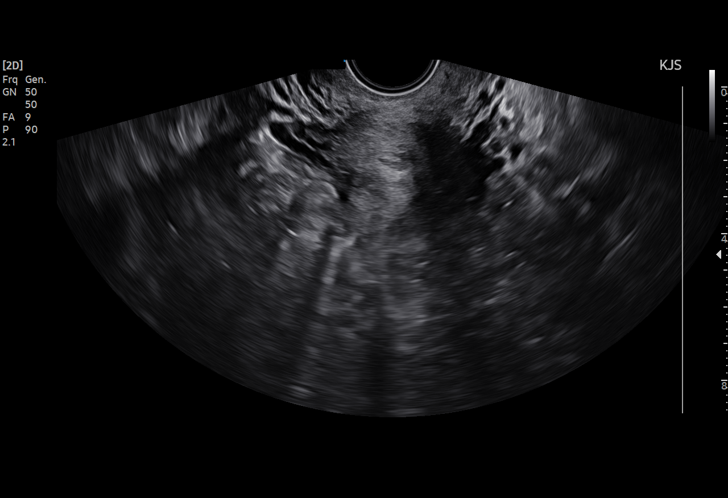
[im 63/100]
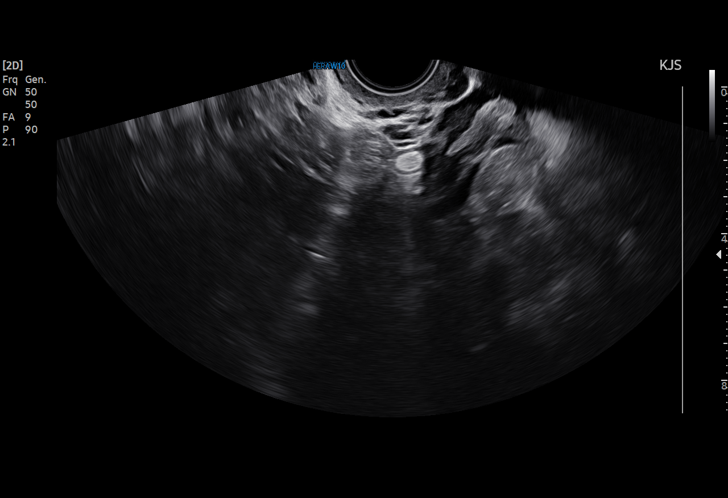
[im 70/100]
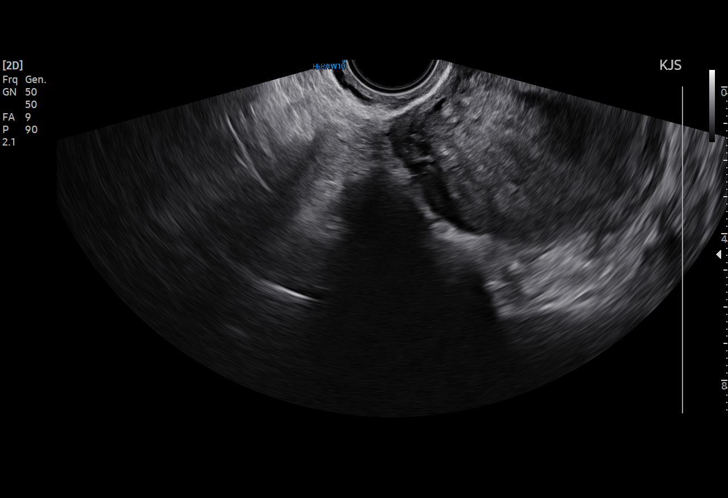
[im 78/100]
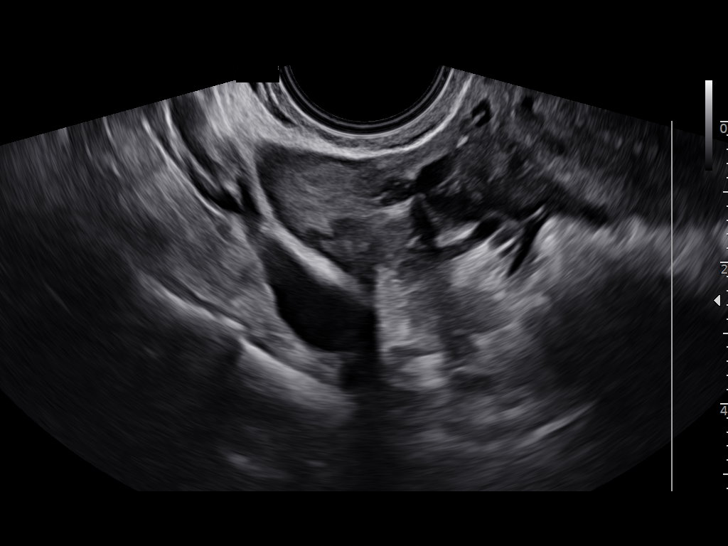
[im 85/100]
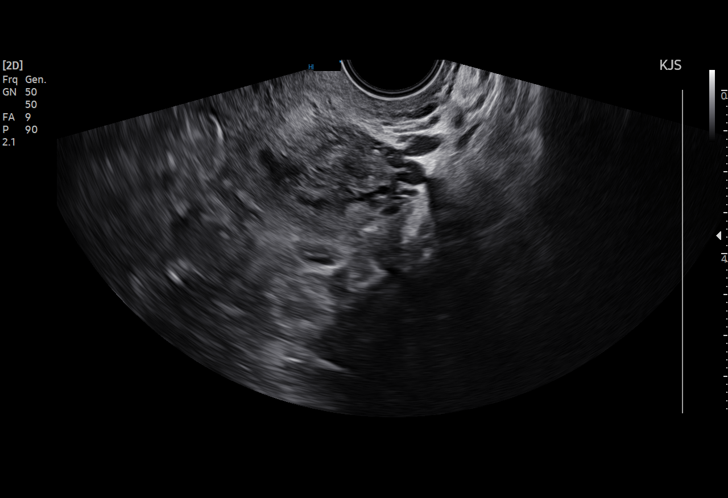
[im 92/100]
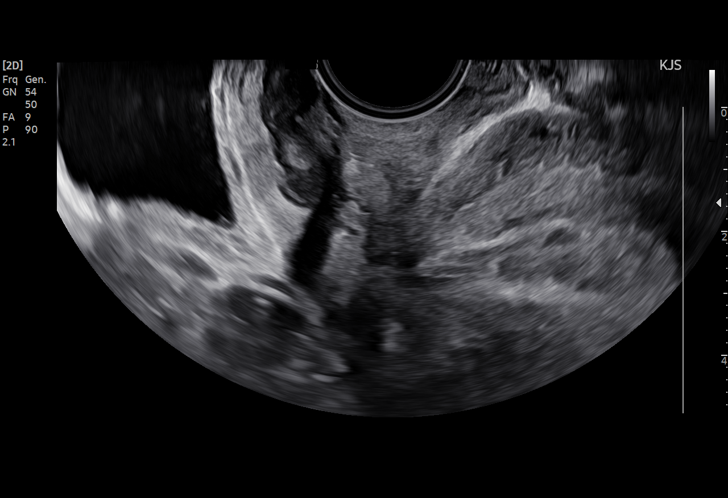
[im 100/100]
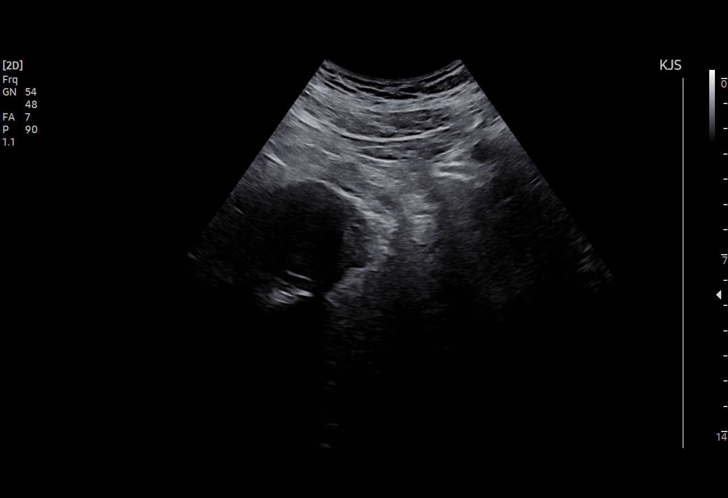

[15 of 28 positions shown; findings below may reference images not displayed]

FINDINGS: Intrauterine gestational sac: None

Maternal uterus/adnexae: Retroflexed uterus. No intrauterine
gestational sac. Right ovarian corpus luteal cyst. Right ovary
appears otherwise unremarkable. No right adnexal mass. Left ovary
not visualized, obscured by shadowing bowel gas. No free fluid
within the pelvis.
IMPRESSION: No intrauterine pregnancy or findings suspicious for ectopic
pregnancy. Findings are consistent with pregnancy of unknown
location and may reflect early intrauterine pregnancy not yet
visualized sonographically, occult ectopic pregnancy, or failed
pregnancy. Recommend trending of beta HCG as well as follow-up
ultrasound in 7-10 days based on clinical course.

## 2022-04-25 MED ORDER — METHOTREXATE FOR ECTOPIC PREGNANCY
50.0000 mg/m2 | Freq: Once | INTRAMUSCULAR | Status: AC
  Administered 2022-04-25: 90 mg via INTRAMUSCULAR
  Filled 2022-04-25: qty 10

## 2022-04-25 NOTE — Telephone Encounter (Signed)
Appointments patient was scheduled for ultrasound today. Per Dr. Talbert Nan conversation today "Up until now she hasn't been having pain, now having some mild pain in her lower back. I've recommended that she go to MAU for further evaluation and possible."  This is why patient cancelled ultrasound.

## 2022-04-25 NOTE — MAU Note (Signed)
.  Sara Bishop is a 46 y.o. here in MAU reporting: lower back pain that started last night. It was worse last night but now she rates it a 1/10. She states that her hcg level is abnormal so her OBGYN told her to be evaluated in MAU. Is currently having some spotting when she wipes.   Pain score: 1 Vitals:   04/25/22 1413  BP: 115/81  Pulse: 67  Resp: 17  Temp: 98.3 F (36.8 C)  SpO2: 100%

## 2022-04-25 NOTE — Progress Notes (Signed)
Chaplain responded to nurse request for visit with pt who is tearful about the imminent termination of an ectopic pregnancy.  Chaplain met pt and her partner at bedside, offering a ministry of presence as pt who is [name] spoke of her concerns that this procedure could be considered an abortion, and yet sensing from her "son" that he didn't want her to feel guilty because it wasn't his time to be born yet.  Chaplin acknowledged pt's feelings and confirmed this early connection with her "son." Pt and baby's father are both Catholic and requested some sort of Rite for their baby at the end of his life.  Chaplain prepared A Rite of Commendation for Infant Sara Bishop that was read bedside and will serve as a mommento of this pregnancy for both parents.    [place]

## 2022-04-25 NOTE — Telephone Encounter (Signed)
Called patient to check on her. She has an elevated risk of SAB and ectopic pregnancy and has had abnormal BhcG levels. 04/19/22 1,589 04/21/22 1,570 04/24/22 1,464 (didn't result until last night, no pain, counseled as to ectopic signs and risks).  The patient was scheduled to come in today for an ultrasound. I called to check on her and talk to her more about possible outcomes. Up until now she hasn't been having pain, now having some mild pain in her lower back. I've recommended that she go to MAU for further evaluation and possible. We briefly discussed possible methotrexate if her Bhcg hasn't dropped significantly and ultrasound doesn't locate the pregnancy.
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
## 2022-04-26 ENCOUNTER — Telehealth: Payer: Self-pay | Admitting: *Deleted

## 2022-04-26 ENCOUNTER — Other Ambulatory Visit: Payer: Self-pay | Admitting: Obstetrics and Gynecology

## 2022-04-26 DIAGNOSIS — O3680X0 Pregnancy with inconclusive fetal viability, not applicable or unspecified: Secondary | ICD-10-CM

## 2022-04-26 LAB — CULTURE, OB URINE: Culture: NO GROWTH

## 2022-04-26 LAB — GC/CHLAMYDIA PROBE AMP (~~LOC~~) NOT AT ARMC
Chlamydia: NEGATIVE
Comment: NEGATIVE
Comment: NORMAL
Neisseria Gonorrhea: NEGATIVE

## 2022-04-26 NOTE — Telephone Encounter (Signed)
LVMTCB

## 2022-04-26 NOTE — Progress Notes (Signed)
See phone message

## 2022-04-26 NOTE — Telephone Encounter (Signed)
I've placed the orders for Bhcg on 6/9 and 6/12. She still needs lab appointments. Please check on how she is doing.

## 2022-04-26 NOTE — Telephone Encounter (Signed)
Routing to Dr. Talbert Nan and [place] Triage.

## 2022-04-26 NOTE — Telephone Encounter (Signed)
-----   Message from Renee Harder, CNM sent at 04/25/2022  6:22 PM EDT ----- Hello,  This patient was seen in MAU on 04/25/2022. She received methotrexate for an ectopic pregnancy. She will need a follow up HCG level on Friday 04/28/2022 as well as on Monday 05/01/2022.    Thanks! Maryagnes Amos, CNM

## 2022-04-26 NOTE — Telephone Encounter (Signed)
Sara Bishop, CMA  Sara Logan, RN Appts have been scheduled. Can you please put the lab orders in. Thank you    Per review of Epic, patient is scheduled for LAB appt on 6/9 at 11am and 6/12 at 11am.    Call returned to patient.  Patient reports continuous lower back pain and intermittent lower abdominal pain 3/10 on pain scale. She is not currently taking anything OTC, states she prefers not to at this time. Bleeding is light to scant. Denies any other symptoms. Patient request to review common side effects of methotrexate, reviewed AVS from ER dated 04/25/22, questions answered. Patient is aware to return call to office if any additional questions. ER precautions reviewed for severe pain and/or heavy bleeding. Patient was appreciative of call.   Routing to provider for final review. Patient is agreeable to disposition. Will close encounter.

## 2022-04-28 ENCOUNTER — Other Ambulatory Visit: Payer: BC Managed Care – PPO

## 2022-04-28 ENCOUNTER — Other Ambulatory Visit: Payer: Self-pay

## 2022-04-28 DIAGNOSIS — O3680X0 Pregnancy with inconclusive fetal viability, not applicable or unspecified: Secondary | ICD-10-CM

## 2022-04-29 LAB — HCG, QUANTITATIVE, PREGNANCY: HCG, Total, QN: 278 m[IU]/mL

## 2022-05-01 ENCOUNTER — Other Ambulatory Visit: Payer: BC Managed Care – PPO

## 2022-05-01 ENCOUNTER — Other Ambulatory Visit: Payer: Self-pay | Admitting: *Deleted

## 2022-05-01 DIAGNOSIS — O3680X0 Pregnancy with inconclusive fetal viability, not applicable or unspecified: Secondary | ICD-10-CM

## 2022-05-02 ENCOUNTER — Other Ambulatory Visit: Payer: Self-pay | Admitting: Obstetrics and Gynecology

## 2022-05-02 DIAGNOSIS — O3680X0 Pregnancy with inconclusive fetal viability, not applicable or unspecified: Secondary | ICD-10-CM

## 2022-05-02 LAB — HCG, QUANTITATIVE, PREGNANCY: HCG, Total, QN: 94 m[IU]/mL

## 2022-05-03 ENCOUNTER — Encounter: Payer: Self-pay | Admitting: Obstetrics and Gynecology

## 2022-05-04 NOTE — Telephone Encounter (Signed)
Please call the patient and check on her. I just saw her mychart message from yesterday afternoon, she is s/p methotrexate treatment for pregnancy of unknown location. Her levels have been falling well. If her pain is significant with use of tylenol, she should be seen. I know she has baseline back pain issues, does this feel different?

## 2022-05-04 NOTE — Telephone Encounter (Signed)
Called patient and she reports feeling better today. Her boyfriend is a Restaurant manager, fast food and he "worked on her" patient said she is not sure what to call exactly what he did. But it helped her feel better today. Patient reports the back pain started on 04/24/22 and then again on 6/8 or 6/9, it was burning sensation in the middle of her back. Report she does have history of IBS and wonders it this could be related to this. Patient said her boyfriend is going to "work on her" again and see if this helps again. She does is taking tylenol for pain. Patient said she doesn't think it would be related to pregnancy, she wasn't sure who to see for back pain.

## 2022-05-08 ENCOUNTER — Other Ambulatory Visit: Payer: BC Managed Care – PPO

## 2022-05-08 ENCOUNTER — Other Ambulatory Visit: Payer: Self-pay | Admitting: *Deleted

## 2022-05-08 ENCOUNTER — Other Ambulatory Visit: Payer: Self-pay

## 2022-05-08 DIAGNOSIS — N926 Irregular menstruation, unspecified: Secondary | ICD-10-CM

## 2022-05-08 DIAGNOSIS — O3680X0 Pregnancy with inconclusive fetal viability, not applicable or unspecified: Secondary | ICD-10-CM

## 2022-05-08 LAB — PREGNANCY, URINE: Preg Test, Ur: NEGATIVE

## 2022-05-09 ENCOUNTER — Other Ambulatory Visit: Payer: Self-pay

## 2022-05-09 DIAGNOSIS — O3680X0 Pregnancy with inconclusive fetal viability, not applicable or unspecified: Secondary | ICD-10-CM

## 2022-05-09 LAB — HCG, QUANTITATIVE, PREGNANCY: HCG, Total, QN: 14 m[IU]/mL

## 2022-05-15 ENCOUNTER — Other Ambulatory Visit: Payer: BC Managed Care – PPO

## 2022-05-15 DIAGNOSIS — O3680X0 Pregnancy with inconclusive fetal viability, not applicable or unspecified: Secondary | ICD-10-CM

## 2022-05-16 LAB — HCG, QUANTITATIVE, PREGNANCY: HCG, Total, QN: <5 m[IU]/mL

## 2022-10-02 ENCOUNTER — Encounter: Payer: Self-pay | Admitting: Psychiatry

## 2022-10-02 ENCOUNTER — Ambulatory Visit (INDEPENDENT_AMBULATORY_CARE_PROVIDER_SITE_OTHER): Payer: BC Managed Care – PPO | Admitting: Psychiatry

## 2022-10-02 DIAGNOSIS — G4700 Insomnia, unspecified: Secondary | ICD-10-CM

## 2022-10-02 DIAGNOSIS — F3281 Premenstrual dysphoric disorder: Secondary | ICD-10-CM

## 2022-10-02 MED ORDER — TRAZODONE HCL 100 MG PO TABS: ORAL_TABLET | ORAL | 2 refills | Status: DC
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
## 2022-10-06 ENCOUNTER — Other Ambulatory Visit: Payer: Self-pay | Admitting: Internal Medicine

## 2022-10-17 ENCOUNTER — Ambulatory Visit: Payer: BC Managed Care – PPO | Admitting: Obstetrics and Gynecology

## 2022-10-17 ENCOUNTER — Encounter: Payer: Self-pay | Admitting: Obstetrics and Gynecology

## 2022-10-17 VITALS — BP 122/84 | HR 85 | Wt 164.0 lb

## 2022-10-17 DIAGNOSIS — N949 Unspecified condition associated with female genital organs and menstrual cycle: Secondary | ICD-10-CM

## 2022-10-17 DIAGNOSIS — N914 Secondary oligomenorrhea: Secondary | ICD-10-CM | POA: Diagnosis not present

## 2022-10-17 LAB — WET PREP FOR TRICH, YEAST, CLUE

## 2022-10-17 LAB — PREGNANCY, URINE: Preg Test, Ur: NEGATIVE

## 2022-10-17 MED ORDER — BETAMETHASONE VALERATE 0.1 % EX OINT: 1.0000 | TOPICAL_OINTMENT | Freq: Two times a day (BID) | CUTANEOUS | 0 refills | Status: AC
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
## 2022-10-18 LAB — PROLACTIN: Prolactin: 17.9 ng/mL

## 2022-10-18 LAB — FOLLICLE STIMULATING HORMONE: FSH: 5.1 m[IU]/mL

## 2022-10-18 LAB — TSH: TSH: 1.22 mIU/L

## 2022-10-25 ENCOUNTER — Other Ambulatory Visit: Payer: Self-pay | Admitting: Psychiatry

## 2022-10-25 DIAGNOSIS — G4700 Insomnia, unspecified: Secondary | ICD-10-CM

## 2022-11-06 ENCOUNTER — Encounter: Payer: Self-pay | Admitting: Obstetrics and Gynecology

## 2022-11-06 NOTE — Telephone Encounter (Signed)
Last mammogram was done Jan 2020 at [facility]

## 2023-01-19 ENCOUNTER — Encounter: Payer: Self-pay | Admitting: Internal Medicine

## 2023-01-19 ENCOUNTER — Ambulatory Visit: Payer: BC Managed Care – PPO | Admitting: Internal Medicine

## 2023-01-19 VITALS — BP 118/72 | HR 79 | Ht 64.0 in | Wt 162.0 lb

## 2023-01-19 DIAGNOSIS — K30 Functional dyspepsia: Secondary | ICD-10-CM

## 2023-01-19 DIAGNOSIS — T781XXA Other adverse food reactions, not elsewhere classified, initial encounter: Secondary | ICD-10-CM

## 2023-01-19 DIAGNOSIS — K581 Irritable bowel syndrome with constipation: Secondary | ICD-10-CM

## 2023-01-19 DIAGNOSIS — T7819XA Other adverse food reactions, not elsewhere classified, initial encounter: Secondary | ICD-10-CM

## 2023-01-19 NOTE — Patient Instructions (Signed)
_______________________________________________________  If your blood pressure at your visit was 140/90 or greater, please contact your primary care physician to follow up on this.  _______________________________________________________  If you are age 48 or older, your body mass index should be between 23-30. Your Body mass index is 27.81 kg/m. If this is out of the aforementioned range listed, please consider follow up with your Primary Care Provider.  If you are age 86 or younger, your body mass index should be between 19-25. Your Body mass index is 27.81 kg/m. If this is out of the aformentioned range listed, please consider follow up with your Primary Care Provider.   ________________________________________________________  The [facility] providers would like to encourage you to use [facility] to communicate with providers for non-urgent requests or questions.  Due to long hold times on the telephone, sending your provider a message by [facility] may be a faster and more efficient way to get a response.  Please allow 48 business hours for a response.  Please remember that this is for non-urgent requests.  _______________________________________________________  Dr Carlean Purl is going to work on a nutrition referral thru [facility].  I appreciate the opportunity to care for you. Silvano Rusk, MD, [facility]
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
## 2023-01-25 ENCOUNTER — Encounter: Payer: Self-pay | Admitting: Internal Medicine

## 2023-02-01 ENCOUNTER — Telehealth: Payer: Self-pay | Admitting: Internal Medicine
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
## 2023-03-07 ENCOUNTER — Encounter: Payer: Self-pay | Admitting: Internal Medicine

## 2023-07-27 ENCOUNTER — Encounter: Payer: Self-pay | Admitting: Internal Medicine

## 2023-07-30 ENCOUNTER — Other Ambulatory Visit: Payer: Self-pay

## 2023-07-30 MED ORDER — HYOSCYAMINE SULFATE 0.125 MG SL SUBL: SUBLINGUAL_TABLET | SUBLINGUAL | 0 refills | Status: DC

## 2023-07-30 NOTE — Telephone Encounter (Signed)
Patient sent a [facility] message, she has moved to [place] and is requesting a refill on her hyoscyamine. I sent that in as requested. She is up to date on office visits. I called and told her that I have sent this in and congratulations on her move to [place]. She is aware Dr Leone Payor is out of the office and it will be next week before he answers her [facility] message.

## 2023-08-21 ENCOUNTER — Other Ambulatory Visit: Payer: Self-pay | Admitting: Internal Medicine

## 2023-10-03 ENCOUNTER — Encounter: Payer: Self-pay | Admitting: Psychiatry

## 2024-04-28 ENCOUNTER — Other Ambulatory Visit: Payer: Self-pay | Admitting: Internal Medicine
